# Patient Record
Sex: Female | Born: 1950 | Race: Black or African American | Hispanic: No | Marital: Married | Smoking: Never smoker
Health system: Southern US, Community
[De-identification: ages and names within clinical notes are randomized; demographics above are authoritative.]

## PROBLEM LIST (undated history)

## (undated) DIAGNOSIS — E785 Hyperlipidemia, unspecified: Secondary | ICD-10-CM

## (undated) DIAGNOSIS — N183 Chronic kidney disease, stage 3 unspecified: Secondary | ICD-10-CM

## (undated) DIAGNOSIS — Z9889 Other specified postprocedural states: Secondary | ICD-10-CM

## (undated) DIAGNOSIS — R9389 Abnormal findings on diagnostic imaging of other specified body structures: Secondary | ICD-10-CM

## (undated) DIAGNOSIS — M810 Age-related osteoporosis without current pathological fracture: Secondary | ICD-10-CM

## (undated) DIAGNOSIS — K08109 Complete loss of teeth, unspecified cause, unspecified class: Secondary | ICD-10-CM

## (undated) DIAGNOSIS — I10 Essential (primary) hypertension: Secondary | ICD-10-CM

## (undated) DIAGNOSIS — R112 Nausea with vomiting, unspecified: Secondary | ICD-10-CM

## (undated) HISTORY — PX: TUBAL LIGATION: SHX77

## (undated) HISTORY — PX: BREAST EXCISIONAL BIOPSY: SUR124

---

## 1999-04-23 ENCOUNTER — Encounter: Payer: Self-pay | Admitting: Gastroenterology

## 1999-04-23 ENCOUNTER — Encounter: Admission: RE | Admit: 1999-04-23 | Discharge: 1999-04-23 | Payer: Self-pay | Admitting: Gastroenterology

## 1999-05-06 ENCOUNTER — Other Ambulatory Visit: Admission: RE | Admit: 1999-05-06 | Discharge: 1999-05-06 | Payer: Self-pay | Admitting: Obstetrics and Gynecology

## 2000-04-24 ENCOUNTER — Encounter: Admission: RE | Admit: 2000-04-24 | Discharge: 2000-04-24 | Payer: Self-pay | Admitting: Obstetrics and Gynecology

## 2000-04-24 ENCOUNTER — Encounter: Payer: Self-pay | Admitting: Obstetrics and Gynecology

## 2000-05-07 ENCOUNTER — Other Ambulatory Visit: Admission: RE | Admit: 2000-05-07 | Discharge: 2000-05-07 | Payer: Self-pay | Admitting: Obstetrics and Gynecology

## 2001-05-06 ENCOUNTER — Encounter: Payer: Self-pay | Admitting: Obstetrics and Gynecology

## 2001-05-06 ENCOUNTER — Encounter: Admission: RE | Admit: 2001-05-06 | Discharge: 2001-05-06 | Payer: Self-pay | Admitting: Obstetrics and Gynecology

## 2001-05-10 ENCOUNTER — Other Ambulatory Visit: Admission: RE | Admit: 2001-05-10 | Discharge: 2001-05-10 | Payer: Self-pay | Admitting: Obstetrics and Gynecology

## 2002-05-18 ENCOUNTER — Encounter: Admission: RE | Admit: 2002-05-18 | Discharge: 2002-05-18 | Payer: Self-pay | Admitting: Obstetrics and Gynecology

## 2002-05-18 ENCOUNTER — Encounter: Payer: Self-pay | Admitting: Obstetrics and Gynecology

## 2002-07-07 HISTORY — PX: BREAST EXCISIONAL BIOPSY: SUR124

## 2003-05-31 ENCOUNTER — Encounter: Admission: RE | Admit: 2003-05-31 | Discharge: 2003-05-31 | Payer: Self-pay | Admitting: Obstetrics and Gynecology

## 2004-06-03 ENCOUNTER — Encounter: Admission: RE | Admit: 2004-06-03 | Discharge: 2004-06-03 | Payer: Self-pay | Admitting: Obstetrics and Gynecology

## 2005-06-23 ENCOUNTER — Encounter: Admission: RE | Admit: 2005-06-23 | Discharge: 2005-06-23 | Payer: Self-pay | Admitting: Obstetrics and Gynecology

## 2005-07-17 ENCOUNTER — Encounter: Admission: RE | Admit: 2005-07-17 | Discharge: 2005-07-17 | Payer: Self-pay

## 2006-07-09 ENCOUNTER — Encounter: Admission: RE | Admit: 2006-07-09 | Discharge: 2006-07-09 | Payer: Self-pay | Admitting: Obstetrics and Gynecology

## 2006-07-29 ENCOUNTER — Encounter: Admission: RE | Admit: 2006-07-29 | Discharge: 2006-07-29 | Payer: Self-pay | Admitting: Obstetrics and Gynecology

## 2007-07-13 ENCOUNTER — Encounter: Admission: RE | Admit: 2007-07-13 | Discharge: 2007-07-13 | Payer: Self-pay | Admitting: Obstetrics and Gynecology

## 2008-07-13 ENCOUNTER — Encounter: Admission: RE | Admit: 2008-07-13 | Discharge: 2008-07-13 | Payer: Self-pay | Admitting: Obstetrics and Gynecology

## 2009-07-16 ENCOUNTER — Encounter: Admission: RE | Admit: 2009-07-16 | Discharge: 2009-07-16 | Payer: Self-pay | Admitting: Obstetrics and Gynecology

## 2010-07-18 ENCOUNTER — Encounter
Admission: RE | Admit: 2010-07-18 | Discharge: 2010-07-18 | Payer: Self-pay | Source: Home / Self Care | Attending: Obstetrics and Gynecology | Admitting: Obstetrics and Gynecology

## 2011-04-23 ENCOUNTER — Observation Stay (HOSPITAL_COMMUNITY)
Admission: EM | Admit: 2011-04-23 | Discharge: 2011-04-24 | Disposition: A | Discharge status: Home / Self Care | Payer: 59 | Source: Ambulatory Visit | Attending: Family Medicine | Admitting: Family Medicine

## 2011-04-23 ENCOUNTER — Emergency Department (HOSPITAL_COMMUNITY): Payer: 59

## 2011-04-23 DIAGNOSIS — N189 Chronic kidney disease, unspecified: Secondary | ICD-10-CM | POA: Insufficient documentation

## 2011-04-23 DIAGNOSIS — E785 Hyperlipidemia, unspecified: Secondary | ICD-10-CM | POA: Insufficient documentation

## 2011-04-23 DIAGNOSIS — I498 Other specified cardiac arrhythmias: Secondary | ICD-10-CM | POA: Insufficient documentation

## 2011-04-23 DIAGNOSIS — R079 Chest pain, unspecified: Principal | ICD-10-CM | POA: Insufficient documentation

## 2011-04-23 LAB — CK TOTAL AND CKMB (NOT AT ARMC)
Relative Index: 1.8 (ref 0.0–2.5)
Total CK: 112 U/L (ref 7–177)

## 2011-04-23 LAB — POCT I-STAT, CHEM 8
Chloride: 110 mEq/L (ref 96–112)
Creatinine, Ser: 1 mg/dL (ref 0.50–1.10)
Glucose, Bld: 93 mg/dL (ref 70–99)
HCT: 42 % (ref 36.0–46.0)
Hemoglobin: 14.3 g/dL (ref 12.0–15.0)
TCO2: 21 mmol/L (ref 0–100)

## 2011-04-23 LAB — POCT I-STAT TROPONIN I: Troponin i, poc: 0 ng/mL (ref 0.00–0.08)

## 2011-04-23 LAB — CARDIAC PANEL(CRET KIN+CKTOT+MB+TROPI): Troponin I: <0.3 ng/mL (ref <0.30)

## 2011-04-24 LAB — CARDIAC PANEL(CRET KIN+CKTOT+MB+TROPI)
Relative Index: INVALID (ref 0.0–2.5)
Troponin I: <0.3 ng/mL (ref <0.30)

## 2011-04-24 LAB — BASIC METABOLIC PANEL
Chloride: 111 mEq/L (ref 96–112)
GFR calc Af Amer: 65 mL/min — ABNORMAL LOW (ref >90)
GFR calc non Af Amer: 56 mL/min — ABNORMAL LOW (ref >90)

## 2011-04-24 LAB — LIPID PANEL
Cholesterol: 200 mg/dL (ref 0–200)
HDL: 68 mg/dL (ref >39)
Total CHOL/HDL Ratio: 2.9 RATIO

## 2011-05-02 NOTE — Discharge Summary (Signed)
NAME:  Mallory Gaines, Mallory Gaines NO.:  [id number]  MEDICAL RECORD NO.:  [id number]  LOCATION:  4705                         FACILITY:  [facility]  PHYSICIAN:  Pleas Koch, MD        DATE OF BIRTH:  06/15/1951  DATE OF ADMISSION:  04/23/2011 DATE OF DISCHARGE:                              DISCHARGE SUMMARY   DISCHARGE DIAGNOSES: 1. Likely, noncardiogenic chest pain. 2. Sinus bradycardia, likely secondary to excellent aerobic     conditioning. 3. Borderline hypertension. 4. Hyperlipidemia with an LDL this admission of 121. 5. Baseline chronic kidney disease, probably stage I.  DISCHARGE MEDICATIONS: 1. Felodipine, please note change from timing from p.m. to am dosing     10 mg. 2. Aspirin 81 mg daily over the counter.  PERTINENT IMAGING STUDIES:  Chest x-ray, 2-view, April 23, 2011, showed mild cardiomegaly.  No active cardiopulmonary disease.  This is briefly a pleasant 60 year old African American female who presented with chest pain while she was getting ready to go to her mother's doctor appointment at 11:00 am.  She was having sharp pain, 8/10 intensity without specific radiation, did not last for very long time.  She has had some of these issues in the past, but states at this time, she had some pain in her left arm as well.  She did note that she did clean her house in entirety the day before coming to the hospital and did unaccustomed pushing and pulling.  The patient is at baseline very active and goes to the gym 3 times a day, and does primarily aerobics.  HOSPITAL COURSE: 1. Chest pain.  She was thought to have a TIMI score of 1 given her     strong family history of the same which equivalents to 5% risk.     She was ruled out by cardiac enzymes.  Repeat EKG done on day of     discharge compared to prior showed sinus rhythm, PR interval is     0.12, left anterior fascicular block, T-wave flattening V2 through     V4, however no overt J-point elevation  or ST-segment elevation.  She was thought to be stable for discharge.  I did get an echocardiogram which has not been reported upon as yet, but will be followed up in the outpatient setting, and if need be, I will call her back with regards to the report if it is abnormal.  I have explained the risks and benefits of going home carefully to the patient, and I have instructed her if she does have recurring chest pain, to present herself back to the emergency room. The patient insists on going home. 1. Sinus bradycardia.  This is sinus rhythm with no pauses on     telemetry, and likely secondary to her excellent cardiovascular     conditions.  No further workup. 2. CKD stage 1.  The patient has some elements of CKD, which is likely     secondary to her hypertensive heart disease.  I would recommend     strongly changing her over from felodipine, which has no     cardioprotective abilities to, either an ACE inhibitor or low-dose  metoprolol.  I am placing her on aspirin empirically 81 mg on     discharge and she can be followed up with Dr. Magda Paganini, her     primary care physician.  I have recommended to her to possibly have     a exercise stress test and within this is shown to be a level of     evidence too, but as she is at low to low risk probability for MI,     this would effectively have high specificity.  I leave this up to     the termination of Dr. Allie Dimmer.  PHYSICAL EXAMINATION:  VITAL SIGNS:  On day of discharge, temperature is 98.1, pulse was 67, respirations 18, blood pressure is 120-113/75-58, O2 sats 95% on room air. GENERAL:  She is pleasant alert, oriented, smiling, in no significant distress. CHEST:  Clinically clear. HEART:  S1, S2.  No murmurs, rubs, or gallops. ABDOMEN:  Soft, nontender.  No peripheral edema.  She did have some reproducibility this time on exam compared to admission exam to her chest pain.  It was pleasure taking care of this patient in the  hospital.  I spent under 30 minutes in time coordinating her discharge.          ______________________________ Pleas Koch, MD     JS/MEDQ  D:  04/24/2011  T:  04/24/2011  Job:  [id number]  Electronically Signed by Pleas Koch MD on 05/02/2011 02:24:59 PM

## 2011-05-02 NOTE — Discharge Summary (Signed)
  NAMEMarland Gaines  SAVON, COBBS NO.:  [id number]  MEDICAL RECORD NO.:  [id number]  LOCATION:  4705                         FACILITY:  [facility]  PHYSICIAN:  Pleas Koch, MD        DATE OF BIRTH:  04-21-1951  DATE OF ADMISSION:  04/23/2011 DATE OF DISCHARGE:  04/24/2011                              DISCHARGE SUMMARY   ADDENDUM  Addendum to dictation number [id number].  The patient did not have echocardiogram.  It is unclear to me as to why this did not occur.  I received a text from Dr. Yates Decamp saying that this was not on his work list, although I had specifically ordered the same.  I did call Ms. Coghlan on April 25, 2011, to confirm she is not having any further chest pain or any other issues.  I have instructed her specifically to see Dr. Earl Gala in 5-7 days and ensure that she is seen for possible risk factor modification, although I think she is at very very low risk of any cardiac event.  She may benefit from outpatient stress test exercise as she is in excellent condition versus Myoview if Dr. Earl Gala who knows her much better than I do feels that this is necessary.  Please mark this dictation as priority.  The patient understood fully the implications of my discussion and was conversant with the same.          ______________________________ Pleas Koch, MD     JS/MEDQ  D:  04/25/2011  T:  04/25/2011  Job:  [id number]  Electronically Signed by Pleas Koch MD on 05/02/2011 02:25:22 PM

## 2011-05-02 NOTE — H&P (Signed)
NAME:  Mallory Gaines, Mallory Gaines NO.:  [id number]  MEDICAL RECORD NO.:  [id number]  LOCATION:  4705                         FACILITY:  [facility]  PHYSICIAN:  Pleas Koch, MD        DATE OF BIRTH:  [birth year]  DATE OF ADMISSION:  04/23/2011 DATE OF DISCHARGE:                             HISTORY & PHYSICAL   CHIEF COMPLAINT:  Chest pain.  HISTORY OF PRESENT ILLNESS:  Briefly, the patient is a very pleasant 60- year-old [place] American female who complained of pain in her chest this morning at around 11:00 a.m.  She said that this was on the left side of her chest and it had 2 qualities; it was heaviness and sharpness and 8/10 in intensity without any specific radiation.  She states, however, that she had some tingling in her left forearm, and when she lay down, she could not get up without severe pain.  The pain seemed to be continues and did not last for a very long time.  She states she has had this pain in the past as well, but never had any arm issues and she could attribute those prior pains to exercise.  Of note, the patient cleaned her house in entirety yesterday, vacuumed, and did a lot of dusting.  The patient took 1 low-dose aspirin 81 mg and then called 911 as she was concerned about this pain, and then came to the emergency room after being told by dispatch to take 3 more aspirin.  By the time she arrived at the emergency room, she had no further chest pain.  REVIEW OF SYSTEMS:  Negative for blurred vision, double vision, shortness of breath, nausea, vomiting, dark urine, dark stool, or weakness on one side of body.  Chest pain at the present time is now 1/10.  She has no constipation.  She has no weakness.  She has no seizures.  She has no rash on her skin.  She has no skin breakdown. Actually, in no distress, and is sitting up, laughing and talking with the family.  PAST MEDICAL HISTORY:  Significant for borderline hypertension which is controlled on  amlodipine.  The patient's med rec has not been completed.  PAST SURGICAL HISTORY:  Significant for BTL 33 years ago, and she attained menopause at about 5-6 years ago at age 55.  She has had a breast biopsy in the past that was negative.  The reports of that are not available to me per E chart.  She is a G 1, P 1.  SOCIAL HISTORY:  She is retired and used to be an Environmental health practitioner at [employer].  She has never smoked or drank.  ALLERGIES:  SHE HAS NO KNOWN DRUG ALLERGIES.  FAMILY HISTORY:  Significant for father passing of a heart attack in his 2s.  Her mother is alive at 84, has dementia, hypertension, diabetes, COPD, and gout.  She has an elder sister who has a history of angina and some of her nieces and nephews have a history of cancer.  PHYSICAL EXAMINATION:  VITAL SIGNS:  On admission into the ED, she had 0/10 pain.  Her blood pressure is 124/75, temperature was 97.7, respirations 18, pulse 49-59,  and O2 sats 97%. HEENT:  On exam upstairs, she has arcus senilis.  She has no pallor, no icterus. NECK:  Soft.  No thyromegaly.  No lymphadenopathy.  I do not appreciate any JVD. CHEST:  Lungs are clear. CARDIOVASCULAR:  S1 and S2.  No murmurs, rubs, or gallops.  She is slightly bradycardic.  PMI is nondisplaced. ABDOMEN:  Soft, nontender, and nondistended. MUSCULOSKELETAL:  Her chest pain is nonreproducible and I am not able to reproduce the same. EXTREMITIES:  Her lower extremities are soft, nontender, and nondistended. CENTRAL NERVOUS SYSTEM:  Grossly intact without any focal deficits. Reflexes are normal.  LABORATORY DATA:  Workup done by ED included I-STAT which showed falling hemoglobin 14.3, and hematocrit 42.  Sodium 142, potassium 3.9, chloride 110, glucose 93, BUN to creatinine ratio is 13/1.0.  Ionized calcium 1.21.  Troponin point of care was 0.  Repeat troponin was negative. First set of cardiac enzymes were CK total was 112, CK-MB was 2.0, relative  index is 1.8.  Mild cardiomegaly seen on chest x-ray.  No active lung disease.  EKG done on arrival showed a rate of about 50-60. PR interval is 0.12.  She has a slightly leftward axis, possible left anterior fascicular block.  I do not appreciate any ST-T wave changes on the EKG.  She has low voltage complexes in V5 and V6.  I do not appreciate any Q-waves.  She has no chamber hypertrophy.  IMPRESSION/ASSESSMENT: 1. The patient has a TIMI score of 1, which equals to a 5% risk of 14     day all cause mortality from myocardial infarction and is at     relatively low risk.  We will rule her out by cardiac enzymes.  I     will get an echocardiogram, and if there is wall motion     abnormality, I will formally consult Cardiology who I have already     consulted for reading of the echocardiogram, Dr. Jacinto Halim.  The     patient is chest pain-free at this time and we will continue her on     aspirin 81 mg daily for now. 2. Hyperlipidemia.  The patient states that Dr. Allie Dimmer has recently     done LDL and it has always been low.  Nevertheless, I will repeat     the same today and ensure that is the case. 3. Hypertension.  She is on felodipine at home, and given her relative     bradycardia, I will hold it for now.  She may benefit from being on     a thiazide versus an ACE inhibitor when she is reviewed. 4. Sinus bradycardia.  This is likely conditioned based on her     excellent conditioning.  The patient mentions she used to go to the     gym 3 times a week and does primarily aerobics, but has not done     for the past month.  The patient will be reviewed on a day-to-day     basis for the same, and will be kept on telemetry.  I spent over an hour in time coordinating this admission in addition to discussion with the family.  If the patient's echocardiogram is negative and she rules out, the patient will likely be discharged back to the care of Dr. Allie Dimmer as an outpatient.  Thank you for this  opportunity to take care of this pleasant patient.          ______________________________ Pleas Koch, MD  JS/MEDQ  D:  04/23/2011  T:  04/23/2011  Job:  [id number]  cc:   Theressa Millard, M.D. Pamella Pert, MD  Electronically Signed by Pleas Koch MD on 05/02/2011 02:25:26 PM

## 2012-12-13 ENCOUNTER — Other Ambulatory Visit: Payer: Self-pay

## 2012-12-13 DIAGNOSIS — Z1231 Encounter for screening mammogram for malignant neoplasm of breast: Secondary | ICD-10-CM

## 2012-12-28 ENCOUNTER — Ambulatory Visit
Admission: RE | Admit: 2012-12-28 | Discharge: 2012-12-28 | Disposition: A | Discharge status: Home / Self Care | Payer: 59 | Source: Ambulatory Visit

## 2012-12-28 DIAGNOSIS — Z1231 Encounter for screening mammogram for malignant neoplasm of breast: Secondary | ICD-10-CM

## 2014-02-24 ENCOUNTER — Other Ambulatory Visit: Payer: Self-pay | Admitting: Obstetrics and Gynecology

## 2014-02-24 DIAGNOSIS — R928 Other abnormal and inconclusive findings on diagnostic imaging of breast: Secondary | ICD-10-CM

## 2014-03-02 ENCOUNTER — Other Ambulatory Visit: Payer: 59

## 2014-03-03 ENCOUNTER — Other Ambulatory Visit: Payer: Self-pay | Admitting: Obstetrics and Gynecology

## 2014-03-03 ENCOUNTER — Ambulatory Visit
Admission: RE | Admit: 2014-03-03 | Discharge: 2014-03-03 | Disposition: A | Discharge status: Home / Self Care | Payer: 59 | Source: Ambulatory Visit | Attending: Obstetrics and Gynecology | Admitting: Obstetrics and Gynecology

## 2014-03-03 ENCOUNTER — Encounter (INDEPENDENT_AMBULATORY_CARE_PROVIDER_SITE_OTHER): Payer: Self-pay

## 2014-03-03 DIAGNOSIS — R928 Other abnormal and inconclusive findings on diagnostic imaging of breast: Secondary | ICD-10-CM

## 2014-03-03 DIAGNOSIS — Z8739 Personal history of other diseases of the musculoskeletal system and connective tissue: Secondary | ICD-10-CM

## 2014-03-06 ENCOUNTER — Ambulatory Visit
Admission: RE | Admit: 2014-03-06 | Discharge: 2014-03-06 | Disposition: A | Discharge status: Home / Self Care | Payer: 59 | Source: Ambulatory Visit | Attending: Obstetrics and Gynecology | Admitting: Obstetrics and Gynecology

## 2014-03-06 DIAGNOSIS — Z8739 Personal history of other diseases of the musculoskeletal system and connective tissue: Secondary | ICD-10-CM

## 2014-05-03 ENCOUNTER — Other Ambulatory Visit: Payer: Self-pay | Admitting: Obstetrics and Gynecology

## 2014-05-03 DIAGNOSIS — N644 Mastodynia: Secondary | ICD-10-CM

## 2014-05-11 ENCOUNTER — Other Ambulatory Visit: Payer: Self-pay | Admitting: Obstetrics and Gynecology

## 2014-05-11 ENCOUNTER — Ambulatory Visit: Payer: 59

## 2014-05-11 ENCOUNTER — Ambulatory Visit
Admission: RE | Admit: 2014-05-11 | Discharge: 2014-05-11 | Disposition: A | Discharge status: Home / Self Care | Payer: 59 | Source: Ambulatory Visit | Attending: Obstetrics and Gynecology | Admitting: Obstetrics and Gynecology

## 2014-05-11 DIAGNOSIS — N644 Mastodynia: Secondary | ICD-10-CM

## 2015-01-24 ENCOUNTER — Ambulatory Visit
Admission: RE | Admit: 2015-01-24 | Discharge: 2015-01-24 | Disposition: A | Discharge status: Home / Self Care | Payer: Commercial Managed Care - HMO | Source: Ambulatory Visit | Attending: Internal Medicine | Admitting: Internal Medicine

## 2015-01-24 ENCOUNTER — Other Ambulatory Visit: Payer: Self-pay | Admitting: Internal Medicine

## 2015-01-24 DIAGNOSIS — M21962 Unspecified acquired deformity of left lower leg: Secondary | ICD-10-CM

## 2015-04-18 ENCOUNTER — Other Ambulatory Visit: Payer: Self-pay

## 2015-04-18 DIAGNOSIS — Z1231 Encounter for screening mammogram for malignant neoplasm of breast: Secondary | ICD-10-CM

## 2015-05-17 ENCOUNTER — Ambulatory Visit
Admission: RE | Admit: 2015-05-17 | Discharge: 2015-05-17 | Disposition: A | Discharge status: Home / Self Care | Payer: Commercial Managed Care - HMO | Source: Ambulatory Visit

## 2015-05-17 DIAGNOSIS — Z1231 Encounter for screening mammogram for malignant neoplasm of breast: Secondary | ICD-10-CM

## 2016-04-14 ENCOUNTER — Other Ambulatory Visit: Payer: Self-pay | Admitting: Obstetrics and Gynecology

## 2016-04-14 DIAGNOSIS — Z1231 Encounter for screening mammogram for malignant neoplasm of breast: Secondary | ICD-10-CM

## 2016-04-23 ENCOUNTER — Ambulatory Visit
Admission: RE | Admit: 2016-04-23 | Discharge: 2016-04-23 | Disposition: A | Discharge status: Home / Self Care | Payer: Medicare Other | Source: Ambulatory Visit | Attending: Internal Medicine | Admitting: Internal Medicine

## 2016-04-23 ENCOUNTER — Other Ambulatory Visit: Payer: Self-pay | Admitting: Internal Medicine

## 2016-04-23 DIAGNOSIS — M1990 Unspecified osteoarthritis, unspecified site: Secondary | ICD-10-CM

## 2016-05-20 ENCOUNTER — Ambulatory Visit
Admission: RE | Admit: 2016-05-20 | Discharge: 2016-05-20 | Disposition: A | Discharge status: Home / Self Care | Payer: Medicare Other | Source: Ambulatory Visit | Attending: Obstetrics and Gynecology | Admitting: Obstetrics and Gynecology

## 2016-05-20 DIAGNOSIS — Z1231 Encounter for screening mammogram for malignant neoplasm of breast: Secondary | ICD-10-CM

## 2016-05-23 ENCOUNTER — Ambulatory Visit
Admission: RE | Admit: 2016-05-23 | Discharge: 2016-05-23 | Disposition: A | Discharge status: Home / Self Care | Payer: Medicare Other | Source: Ambulatory Visit | Attending: Internal Medicine | Admitting: Internal Medicine

## 2016-05-23 ENCOUNTER — Other Ambulatory Visit: Payer: Self-pay | Admitting: Internal Medicine

## 2016-05-23 DIAGNOSIS — M5410 Radiculopathy, site unspecified: Secondary | ICD-10-CM

## 2016-09-03 ENCOUNTER — Other Ambulatory Visit: Payer: Self-pay | Admitting: Obstetrics and Gynecology

## 2016-09-03 DIAGNOSIS — N644 Mastodynia: Secondary | ICD-10-CM

## 2016-09-08 ENCOUNTER — Ambulatory Visit
Admission: RE | Admit: 2016-09-08 | Discharge: 2016-09-08 | Disposition: A | Discharge status: Home / Self Care | Payer: Medicare Other | Source: Ambulatory Visit | Attending: Obstetrics and Gynecology | Admitting: Obstetrics and Gynecology

## 2016-09-08 DIAGNOSIS — N644 Mastodynia: Secondary | ICD-10-CM

## 2017-04-07 ENCOUNTER — Other Ambulatory Visit: Payer: Self-pay | Admitting: Obstetrics and Gynecology

## 2017-04-07 DIAGNOSIS — Z1231 Encounter for screening mammogram for malignant neoplasm of breast: Secondary | ICD-10-CM

## 2017-05-21 ENCOUNTER — Ambulatory Visit
Admission: RE | Admit: 2017-05-21 | Discharge: 2017-05-21 | Disposition: A | Discharge status: Home / Self Care | Payer: Medicare Other | Source: Ambulatory Visit | Attending: Obstetrics and Gynecology | Admitting: Obstetrics and Gynecology

## 2017-05-21 ENCOUNTER — Encounter: Payer: Self-pay | Admitting: Radiology

## 2017-05-21 DIAGNOSIS — Z1231 Encounter for screening mammogram for malignant neoplasm of breast: Secondary | ICD-10-CM

## 2017-11-02 ENCOUNTER — Ambulatory Visit
Admission: RE | Admit: 2017-11-02 | Discharge: 2017-11-02 | Disposition: A | Discharge status: Home / Self Care | Payer: Medicare Other | Source: Ambulatory Visit | Attending: Internal Medicine | Admitting: Internal Medicine

## 2017-11-02 ENCOUNTER — Other Ambulatory Visit: Payer: Self-pay | Admitting: Internal Medicine

## 2017-11-02 DIAGNOSIS — R2243 Localized swelling, mass and lump, lower limb, bilateral: Secondary | ICD-10-CM

## 2017-12-13 ENCOUNTER — Emergency Department (HOSPITAL_COMMUNITY)
Admission: EM | Admit: 2017-12-13 | Discharge: 2017-12-13 | Disposition: A | Discharge status: Home / Self Care | Payer: Medicare Other | Attending: Emergency Medicine | Admitting: Emergency Medicine

## 2017-12-13 ENCOUNTER — Encounter (HOSPITAL_COMMUNITY): Payer: Self-pay | Admitting: Emergency Medicine

## 2017-12-13 DIAGNOSIS — W57XXXA Bitten or stung by nonvenomous insect and other nonvenomous arthropods, initial encounter: Secondary | ICD-10-CM

## 2017-12-13 DIAGNOSIS — Z23 Encounter for immunization: Secondary | ICD-10-CM | POA: Diagnosis not present

## 2017-12-13 DIAGNOSIS — W57XXXD Bitten or stung by nonvenomous insect and other nonvenomous arthropods, subsequent encounter: Secondary | ICD-10-CM | POA: Insufficient documentation

## 2017-12-13 DIAGNOSIS — Y939 Activity, unspecified: Secondary | ICD-10-CM | POA: Insufficient documentation

## 2017-12-13 DIAGNOSIS — Y999 Unspecified external cause status: Secondary | ICD-10-CM | POA: Diagnosis not present

## 2017-12-13 DIAGNOSIS — S1086XD Insect bite of other specified part of neck, subsequent encounter: Secondary | ICD-10-CM | POA: Insufficient documentation

## 2017-12-13 DIAGNOSIS — I10 Essential (primary) hypertension: Secondary | ICD-10-CM | POA: Diagnosis not present

## 2017-12-13 DIAGNOSIS — Y929 Unspecified place or not applicable: Secondary | ICD-10-CM | POA: Insufficient documentation

## 2017-12-13 HISTORY — DX: Essential (primary) hypertension: I10

## 2017-12-13 MED ORDER — TETANUS-DIPHTH-ACELL PERTUSSIS 5-2.5-18.5 LF-MCG/0.5 IM SUSP
0.5000 mL | Freq: Once | INTRAMUSCULAR | Status: AC
  Administered 2017-12-13: 0.5 mL via INTRAMUSCULAR
  Filled 2017-12-13: qty 0.5

## 2017-12-13 NOTE — ED Triage Notes (Addendum)
Pt has a tick embedded in her neck tissue, movement noted to the tick legs on assessment. Alcohol pads placed over top. Pt states it has been there for several days and she thought it was a mole, plucked a leg that she thought was a hair.

## 2017-12-13 NOTE — Discharge Instructions (Addendum)
If you develop any signs of RMSF return to the ED.

## 2017-12-13 NOTE — ED Provider Notes (Signed)
[facility]   CSN: [id number] Arrival date & time: 12/13/17  1711     History   Chief Complaint No chief complaint on file.   HPI Mallory Gaines is a 67 y.o. female who presents to the ED with a tick bite to the left side of her neck. Patient reports that it has been there for 4 days. When she first saw it she thought it was a mole but then today she felt it moving and realized it was a tick.  The history is provided by the patient. No language interpreter was used.  Animal Bite  Attacking animal: tick. Location:  Head/neck Head/neck injury location:  L neck Time since incident:  4 days Pain details:    Severity:  No pain Incident location:  Unable to specify   Past Medical History:  Diagnosis Date  . Hypertension     There are no active problems to display for this patient.   Past Surgical History:  Procedure Laterality Date  . BREAST EXCISIONAL BIOPSY Left      OB History   None      Home Medications    Prior to Admission medications   Not on File    Family History Family History  Problem Relation Age of Onset  . Breast cancer Other     Social History Social History   Tobacco Use  . Smoking status: Not on file  Substance Use Topics  . Alcohol use: Not on file  . Drug use: Not on file     Allergies   Patient has no known allergies.   Review of Systems Review of Systems  Skin: Positive for wound.  All other systems reviewed and are negative.    Physical Exam Updated Vital Signs BP 132/75   Pulse 86   Temp 98.2 F (36.8 C)   Resp 18   SpO2 98%   Physical Exam  Constitutional: She appears well-developed and well-nourished. No distress.  HENT:  Head: Normocephalic.  Eyes: EOM are normal.  Neck: Neck supple.  Tick attached to the left side of neck.  Cardiovascular: Normal rate.  Pulmonary/Chest: Effort normal.  Abdominal: Soft. There is no tenderness.    Musculoskeletal: Normal range of motion.  Neurological: She is alert.  Skin: Skin is warm and dry.  Psychiatric: She has a normal mood and affect. Her behavior is normal.  Nursing note and vitals reviewed.    ED Treatments / Results  Labs (all labs ordered are listed, but only abnormal results are displayed) Labs Reviewed - No data to display  Radiology No results found.  Procedures .Foreign Body Removal Date/Time: 12/13/2017 5:37 PM Performed by: Janne NapoleonNeese, Daved Mcfann M, NP Authorized by: Janne NapoleonNeese, Garion Wempe M, NP  Consent: Verbal consent obtained. Consent given by: patient Patient understanding: patient states understanding of the procedure being performed Required items: required blood products, implants, devices, and special equipment available Patient identity confirmed: verbally with patient Body area: skin Anesthesia method: none.  Sedation: Patient sedated: no  Patient restrained: no Complexity: simple 1 objects recovered. Objects recovered: tick Post-procedure assessment: foreign body removed Patient tolerance: Patient tolerated the procedure well with no immediate complications Comments: Cleaned with alcohol, bacitracin ointment and dressing. Tetanus updated.    (including critical care time)  Medications Ordered in ED Medications  Tdap (BOOSTRIX) injection 0.5 mL (has no administration in time range)     Initial Impression / Assessment and Plan / ED Course  I have reviewed  the triage vital signs and the nursing notes. 67 y.o. female here for tick removal stable for d/c without signs of RMSF. Discussed with the patient signs and symptoms and return precautions.   Final Clinical Impressions(s) / ED Diagnoses   Final diagnoses:  Tick bite with subsequent removal of tick    ED Discharge Orders    None       Kerrie Buffalo Sterling, NP 12/13/17 1741    Charlynne Pander, MD 12/15/17 [phone]

## 2017-12-13 NOTE — ED Notes (Signed)
Tick removed by NP and RN. Bacitracin and band-aid placed on neck.

## 2018-03-23 ENCOUNTER — Other Ambulatory Visit: Payer: Self-pay | Admitting: Obstetrics and Gynecology

## 2018-03-23 DIAGNOSIS — Z1231 Encounter for screening mammogram for malignant neoplasm of breast: Secondary | ICD-10-CM

## 2018-05-24 ENCOUNTER — Ambulatory Visit
Admission: RE | Admit: 2018-05-24 | Discharge: 2018-05-24 | Disposition: A | Discharge status: Home / Self Care | Payer: Medicare Other | Source: Ambulatory Visit | Attending: Obstetrics and Gynecology | Admitting: Obstetrics and Gynecology

## 2018-05-24 DIAGNOSIS — Z1231 Encounter for screening mammogram for malignant neoplasm of breast: Secondary | ICD-10-CM

## 2018-07-19 ENCOUNTER — Other Ambulatory Visit: Payer: Self-pay | Admitting: Internal Medicine

## 2018-07-19 DIAGNOSIS — M810 Age-related osteoporosis without current pathological fracture: Secondary | ICD-10-CM

## 2018-08-11 ENCOUNTER — Other Ambulatory Visit: Payer: Self-pay | Admitting: Obstetrics and Gynecology

## 2018-08-11 DIAGNOSIS — Z9189 Other specified personal risk factors, not elsewhere classified: Secondary | ICD-10-CM

## 2018-08-24 ENCOUNTER — Ambulatory Visit
Admission: RE | Admit: 2018-08-24 | Discharge: 2018-08-24 | Disposition: A | Discharge status: Home / Self Care | Payer: No Typology Code available for payment source | Source: Ambulatory Visit | Attending: Obstetrics and Gynecology | Admitting: Obstetrics and Gynecology

## 2018-08-24 DIAGNOSIS — Z9189 Other specified personal risk factors, not elsewhere classified: Secondary | ICD-10-CM

## 2018-08-24 MED ORDER — GADOBUTROL 1 MMOL/ML IV SOLN
7.0000 mL | Freq: Once | INTRAVENOUS | Status: AC | PRN
  Administered 2018-08-24: 7 mL via INTRAVENOUS

## 2018-09-10 ENCOUNTER — Ambulatory Visit
Admission: RE | Admit: 2018-09-10 | Discharge: 2018-09-10 | Disposition: A | Discharge status: Home / Self Care | Payer: Medicare Other | Source: Ambulatory Visit | Attending: Internal Medicine | Admitting: Internal Medicine

## 2018-09-10 DIAGNOSIS — M810 Age-related osteoporosis without current pathological fracture: Secondary | ICD-10-CM

## 2019-04-19 ENCOUNTER — Other Ambulatory Visit: Payer: Self-pay | Admitting: Internal Medicine

## 2019-04-19 DIAGNOSIS — Z1231 Encounter for screening mammogram for malignant neoplasm of breast: Secondary | ICD-10-CM

## 2019-05-02 ENCOUNTER — Other Ambulatory Visit: Payer: Self-pay | Admitting: Obstetrics and Gynecology

## 2019-05-02 DIAGNOSIS — N644 Mastodynia: Secondary | ICD-10-CM

## 2019-05-10 ENCOUNTER — Ambulatory Visit
Admission: RE | Admit: 2019-05-10 | Discharge: 2019-05-10 | Disposition: A | Discharge status: Home / Self Care | Payer: Medicare Other | Source: Ambulatory Visit | Attending: Obstetrics and Gynecology | Admitting: Obstetrics and Gynecology

## 2019-05-10 ENCOUNTER — Other Ambulatory Visit: Payer: Self-pay

## 2019-05-10 DIAGNOSIS — N644 Mastodynia: Secondary | ICD-10-CM

## 2019-06-07 ENCOUNTER — Ambulatory Visit
Admission: RE | Admit: 2019-06-07 | Discharge: 2019-06-07 | Disposition: A | Discharge status: Home / Self Care | Payer: Medicare Other | Source: Ambulatory Visit | Attending: Internal Medicine | Admitting: Internal Medicine

## 2019-06-07 ENCOUNTER — Other Ambulatory Visit: Payer: Self-pay

## 2019-06-07 DIAGNOSIS — Z1231 Encounter for screening mammogram for malignant neoplasm of breast: Secondary | ICD-10-CM

## 2019-07-29 ENCOUNTER — Other Ambulatory Visit: Payer: Self-pay | Admitting: Internal Medicine

## 2019-07-29 ENCOUNTER — Ambulatory Visit
Admission: RE | Admit: 2019-07-29 | Discharge: 2019-07-29 | Disposition: A | Discharge status: Home / Self Care | Payer: Medicare Other | Source: Ambulatory Visit | Attending: Internal Medicine | Admitting: Internal Medicine

## 2019-07-29 DIAGNOSIS — R1012 Left upper quadrant pain: Secondary | ICD-10-CM

## 2019-09-29 ENCOUNTER — Other Ambulatory Visit: Payer: Self-pay | Admitting: Gastroenterology

## 2019-09-29 DIAGNOSIS — R1032 Left lower quadrant pain: Secondary | ICD-10-CM

## 2019-10-14 ENCOUNTER — Ambulatory Visit
Admission: RE | Admit: 2019-10-14 | Discharge: 2019-10-14 | Disposition: A | Discharge status: Home / Self Care | Payer: Medicare Other | Source: Ambulatory Visit | Attending: Gastroenterology | Admitting: Gastroenterology

## 2019-10-14 DIAGNOSIS — R1032 Left lower quadrant pain: Secondary | ICD-10-CM

## 2019-10-14 MED ORDER — IOPAMIDOL (ISOVUE-300) INJECTION 61%
100.0000 mL | Freq: Once | INTRAVENOUS | Status: AC | PRN
  Administered 2019-10-14: 100 mL via INTRAVENOUS

## 2020-04-06 ENCOUNTER — Other Ambulatory Visit: Payer: Self-pay | Admitting: Obstetrics and Gynecology

## 2020-04-06 DIAGNOSIS — N644 Mastodynia: Secondary | ICD-10-CM

## 2020-04-16 ENCOUNTER — Ambulatory Visit: Admission: RE | Admit: 2020-04-16 | Payer: Medicare Other | Source: Ambulatory Visit

## 2020-04-16 ENCOUNTER — Ambulatory Visit
Admission: RE | Admit: 2020-04-16 | Discharge: 2020-04-16 | Disposition: A | Discharge status: Home / Self Care | Payer: Medicare Other | Source: Ambulatory Visit | Attending: Obstetrics and Gynecology | Admitting: Obstetrics and Gynecology

## 2020-04-16 ENCOUNTER — Other Ambulatory Visit: Payer: Self-pay

## 2020-04-16 DIAGNOSIS — N644 Mastodynia: Secondary | ICD-10-CM

## 2020-04-25 ENCOUNTER — Other Ambulatory Visit: Payer: Medicare Other

## 2020-04-26 ENCOUNTER — Other Ambulatory Visit: Payer: Self-pay | Admitting: Obstetrics and Gynecology

## 2020-04-26 DIAGNOSIS — Z1231 Encounter for screening mammogram for malignant neoplasm of breast: Secondary | ICD-10-CM

## 2020-06-07 ENCOUNTER — Ambulatory Visit: Payer: Medicare Other

## 2020-07-18 ENCOUNTER — Ambulatory Visit: Payer: Medicare Other

## 2020-07-30 ENCOUNTER — Ambulatory Visit
Admission: RE | Admit: 2020-07-30 | Discharge: 2020-07-30 | Disposition: A | Discharge status: Home / Self Care | Payer: Medicare Other | Source: Ambulatory Visit | Attending: Obstetrics and Gynecology | Admitting: Obstetrics and Gynecology

## 2020-07-30 ENCOUNTER — Other Ambulatory Visit: Payer: Self-pay

## 2020-07-30 DIAGNOSIS — Z1231 Encounter for screening mammogram for malignant neoplasm of breast: Secondary | ICD-10-CM

## 2020-08-14 DIAGNOSIS — E785 Hyperlipidemia, unspecified: Secondary | ICD-10-CM | POA: Diagnosis not present

## 2020-08-14 DIAGNOSIS — K589 Irritable bowel syndrome without diarrhea: Secondary | ICD-10-CM | POA: Diagnosis not present

## 2020-08-14 DIAGNOSIS — I10 Essential (primary) hypertension: Secondary | ICD-10-CM | POA: Diagnosis not present

## 2020-08-14 DIAGNOSIS — M1990 Unspecified osteoarthritis, unspecified site: Secondary | ICD-10-CM | POA: Diagnosis not present

## 2020-08-14 DIAGNOSIS — J45909 Unspecified asthma, uncomplicated: Secondary | ICD-10-CM | POA: Diagnosis not present

## 2020-08-14 DIAGNOSIS — Z1389 Encounter for screening for other disorder: Secondary | ICD-10-CM | POA: Diagnosis not present

## 2020-08-14 DIAGNOSIS — M810 Age-related osteoporosis without current pathological fracture: Secondary | ICD-10-CM | POA: Diagnosis not present

## 2020-08-14 DIAGNOSIS — Z7189 Other specified counseling: Secondary | ICD-10-CM | POA: Diagnosis not present

## 2020-08-14 DIAGNOSIS — Z0000 Encounter for general adult medical examination without abnormal findings: Secondary | ICD-10-CM | POA: Diagnosis not present

## 2020-08-14 DIAGNOSIS — N183 Chronic kidney disease, stage 3 unspecified: Secondary | ICD-10-CM | POA: Diagnosis not present

## 2020-08-22 DIAGNOSIS — R072 Precordial pain: Secondary | ICD-10-CM | POA: Diagnosis not present

## 2020-08-22 DIAGNOSIS — N183 Chronic kidney disease, stage 3 unspecified: Secondary | ICD-10-CM | POA: Diagnosis not present

## 2020-08-22 DIAGNOSIS — E785 Hyperlipidemia, unspecified: Secondary | ICD-10-CM | POA: Diagnosis not present

## 2020-10-09 DIAGNOSIS — R1084 Generalized abdominal pain: Secondary | ICD-10-CM | POA: Diagnosis not present

## 2020-10-09 DIAGNOSIS — Z86010 Personal history of colonic polyps: Secondary | ICD-10-CM | POA: Diagnosis not present

## 2020-10-09 DIAGNOSIS — K649 Unspecified hemorrhoids: Secondary | ICD-10-CM | POA: Diagnosis not present

## 2020-10-09 DIAGNOSIS — K5900 Constipation, unspecified: Secondary | ICD-10-CM | POA: Diagnosis not present

## 2021-03-20 DIAGNOSIS — I10 Essential (primary) hypertension: Secondary | ICD-10-CM | POA: Diagnosis not present

## 2021-03-20 DIAGNOSIS — J302 Other seasonal allergic rhinitis: Secondary | ICD-10-CM | POA: Diagnosis not present

## 2021-03-20 DIAGNOSIS — Z23 Encounter for immunization: Secondary | ICD-10-CM | POA: Diagnosis not present

## 2021-03-20 DIAGNOSIS — M8588 Other specified disorders of bone density and structure, other site: Secondary | ICD-10-CM | POA: Diagnosis not present

## 2021-03-20 DIAGNOSIS — E785 Hyperlipidemia, unspecified: Secondary | ICD-10-CM | POA: Diagnosis not present

## 2021-04-29 ENCOUNTER — Other Ambulatory Visit: Payer: Self-pay | Admitting: Internal Medicine

## 2021-04-29 DIAGNOSIS — Z1231 Encounter for screening mammogram for malignant neoplasm of breast: Secondary | ICD-10-CM

## 2021-05-06 DIAGNOSIS — H5211 Myopia, right eye: Secondary | ICD-10-CM | POA: Diagnosis not present

## 2021-05-06 DIAGNOSIS — H25013 Cortical age-related cataract, bilateral: Secondary | ICD-10-CM | POA: Diagnosis not present

## 2021-05-06 DIAGNOSIS — H2513 Age-related nuclear cataract, bilateral: Secondary | ICD-10-CM | POA: Diagnosis not present

## 2021-05-06 DIAGNOSIS — H524 Presbyopia: Secondary | ICD-10-CM | POA: Diagnosis not present

## 2021-07-19 DIAGNOSIS — R300 Dysuria: Secondary | ICD-10-CM | POA: Diagnosis not present

## 2021-08-01 ENCOUNTER — Other Ambulatory Visit: Payer: Self-pay

## 2021-08-01 ENCOUNTER — Ambulatory Visit
Admission: RE | Admit: 2021-08-01 | Discharge: 2021-08-01 | Disposition: A | Discharge status: Home / Self Care | Payer: Medicare Other | Source: Ambulatory Visit | Attending: Internal Medicine | Admitting: Internal Medicine

## 2021-08-01 DIAGNOSIS — Z1231 Encounter for screening mammogram for malignant neoplasm of breast: Secondary | ICD-10-CM | POA: Diagnosis not present

## 2021-08-09 ENCOUNTER — Other Ambulatory Visit: Payer: Self-pay | Admitting: Internal Medicine

## 2021-08-09 DIAGNOSIS — M810 Age-related osteoporosis without current pathological fracture: Secondary | ICD-10-CM

## 2021-08-28 DIAGNOSIS — M85859 Other specified disorders of bone density and structure, unspecified thigh: Secondary | ICD-10-CM | POA: Diagnosis not present

## 2021-08-28 DIAGNOSIS — Z0000 Encounter for general adult medical examination without abnormal findings: Secondary | ICD-10-CM | POA: Diagnosis not present

## 2021-08-28 DIAGNOSIS — J301 Allergic rhinitis due to pollen: Secondary | ICD-10-CM | POA: Diagnosis not present

## 2021-08-28 DIAGNOSIS — E785 Hyperlipidemia, unspecified: Secondary | ICD-10-CM | POA: Diagnosis not present

## 2021-08-28 DIAGNOSIS — Z1389 Encounter for screening for other disorder: Secondary | ICD-10-CM | POA: Diagnosis not present

## 2021-08-28 DIAGNOSIS — I10 Essential (primary) hypertension: Secondary | ICD-10-CM | POA: Diagnosis not present

## 2021-08-28 DIAGNOSIS — Z1211 Encounter for screening for malignant neoplasm of colon: Secondary | ICD-10-CM | POA: Diagnosis not present

## 2021-08-28 DIAGNOSIS — K5790 Diverticulosis of intestine, part unspecified, without perforation or abscess without bleeding: Secondary | ICD-10-CM | POA: Diagnosis not present

## 2021-08-28 DIAGNOSIS — Z1231 Encounter for screening mammogram for malignant neoplasm of breast: Secondary | ICD-10-CM | POA: Diagnosis not present

## 2021-08-28 DIAGNOSIS — J45909 Unspecified asthma, uncomplicated: Secondary | ICD-10-CM | POA: Diagnosis not present

## 2021-08-28 DIAGNOSIS — Z86010 Personal history of colonic polyps: Secondary | ICD-10-CM | POA: Diagnosis not present

## 2021-09-06 ENCOUNTER — Ambulatory Visit
Admission: RE | Admit: 2021-09-06 | Discharge: 2021-09-06 | Disposition: A | Discharge status: Home / Self Care | Payer: Medicare Other | Source: Ambulatory Visit | Attending: Internal Medicine | Admitting: Internal Medicine

## 2021-09-06 DIAGNOSIS — Z780 Asymptomatic menopausal state: Secondary | ICD-10-CM | POA: Diagnosis not present

## 2021-09-06 DIAGNOSIS — M8589 Other specified disorders of bone density and structure, multiple sites: Secondary | ICD-10-CM | POA: Diagnosis not present

## 2021-09-06 DIAGNOSIS — M810 Age-related osteoporosis without current pathological fracture: Secondary | ICD-10-CM | POA: Diagnosis not present

## 2021-09-20 DIAGNOSIS — M8580 Other specified disorders of bone density and structure, unspecified site: Secondary | ICD-10-CM | POA: Diagnosis not present

## 2021-11-18 DIAGNOSIS — L720 Epidermal cyst: Secondary | ICD-10-CM | POA: Diagnosis not present

## 2021-11-18 DIAGNOSIS — L819 Disorder of pigmentation, unspecified: Secondary | ICD-10-CM | POA: Diagnosis not present

## 2021-11-18 DIAGNOSIS — L820 Inflamed seborrheic keratosis: Secondary | ICD-10-CM | POA: Diagnosis not present

## 2021-11-18 DIAGNOSIS — L821 Other seborrheic keratosis: Secondary | ICD-10-CM | POA: Diagnosis not present

## 2022-02-27 DIAGNOSIS — M7918 Myalgia, other site: Secondary | ICD-10-CM | POA: Diagnosis not present

## 2022-02-27 DIAGNOSIS — M810 Age-related osteoporosis without current pathological fracture: Secondary | ICD-10-CM | POA: Diagnosis not present

## 2022-02-27 DIAGNOSIS — I10 Essential (primary) hypertension: Secondary | ICD-10-CM | POA: Diagnosis not present

## 2022-02-27 DIAGNOSIS — N183 Chronic kidney disease, stage 3 unspecified: Secondary | ICD-10-CM | POA: Diagnosis not present

## 2022-02-27 DIAGNOSIS — N1831 Chronic kidney disease, stage 3a: Secondary | ICD-10-CM | POA: Diagnosis not present

## 2022-03-21 DIAGNOSIS — H5211 Myopia, right eye: Secondary | ICD-10-CM | POA: Diagnosis not present

## 2022-03-21 DIAGNOSIS — H524 Presbyopia: Secondary | ICD-10-CM | POA: Diagnosis not present

## 2022-03-21 DIAGNOSIS — H25013 Cortical age-related cataract, bilateral: Secondary | ICD-10-CM | POA: Diagnosis not present

## 2022-03-21 DIAGNOSIS — H2513 Age-related nuclear cataract, bilateral: Secondary | ICD-10-CM | POA: Diagnosis not present

## 2022-04-19 DIAGNOSIS — Z23 Encounter for immunization: Secondary | ICD-10-CM | POA: Diagnosis not present

## 2022-05-07 HISTORY — PX: COLONOSCOPY: SHX174

## 2022-05-19 ENCOUNTER — Other Ambulatory Visit: Payer: Self-pay | Admitting: Internal Medicine

## 2022-05-19 DIAGNOSIS — Z1231 Encounter for screening mammogram for malignant neoplasm of breast: Secondary | ICD-10-CM

## 2022-06-04 DIAGNOSIS — K5730 Diverticulosis of large intestine without perforation or abscess without bleeding: Secondary | ICD-10-CM | POA: Diagnosis not present

## 2022-06-04 DIAGNOSIS — Z09 Encounter for follow-up examination after completed treatment for conditions other than malignant neoplasm: Secondary | ICD-10-CM | POA: Diagnosis not present

## 2022-06-04 DIAGNOSIS — D120 Benign neoplasm of cecum: Secondary | ICD-10-CM | POA: Diagnosis not present

## 2022-06-04 DIAGNOSIS — Z86010 Personal history of colonic polyps: Secondary | ICD-10-CM | POA: Diagnosis not present

## 2022-06-04 DIAGNOSIS — D123 Benign neoplasm of transverse colon: Secondary | ICD-10-CM | POA: Diagnosis not present

## 2022-06-04 DIAGNOSIS — K648 Other hemorrhoids: Secondary | ICD-10-CM | POA: Diagnosis not present

## 2022-06-04 DIAGNOSIS — D122 Benign neoplasm of ascending colon: Secondary | ICD-10-CM | POA: Diagnosis not present

## 2022-06-06 DIAGNOSIS — D123 Benign neoplasm of transverse colon: Secondary | ICD-10-CM | POA: Diagnosis not present

## 2022-06-06 DIAGNOSIS — D120 Benign neoplasm of cecum: Secondary | ICD-10-CM | POA: Diagnosis not present

## 2022-06-06 DIAGNOSIS — D122 Benign neoplasm of ascending colon: Secondary | ICD-10-CM | POA: Diagnosis not present

## 2022-07-04 IMAGING — MG MM DIGITAL SCREENING BILAT W/ TOMO AND CAD
6 of 10 series · 6 of 30 positions shown · non-contrast
Comparison: Previous exam(s).

CLINICAL DATA: Screening.

EXAM:
DIGITAL SCREENING BILATERAL MAMMOGRAM WITH TOMO AND CAD

[L MLO synth-2D (1 of 2)]
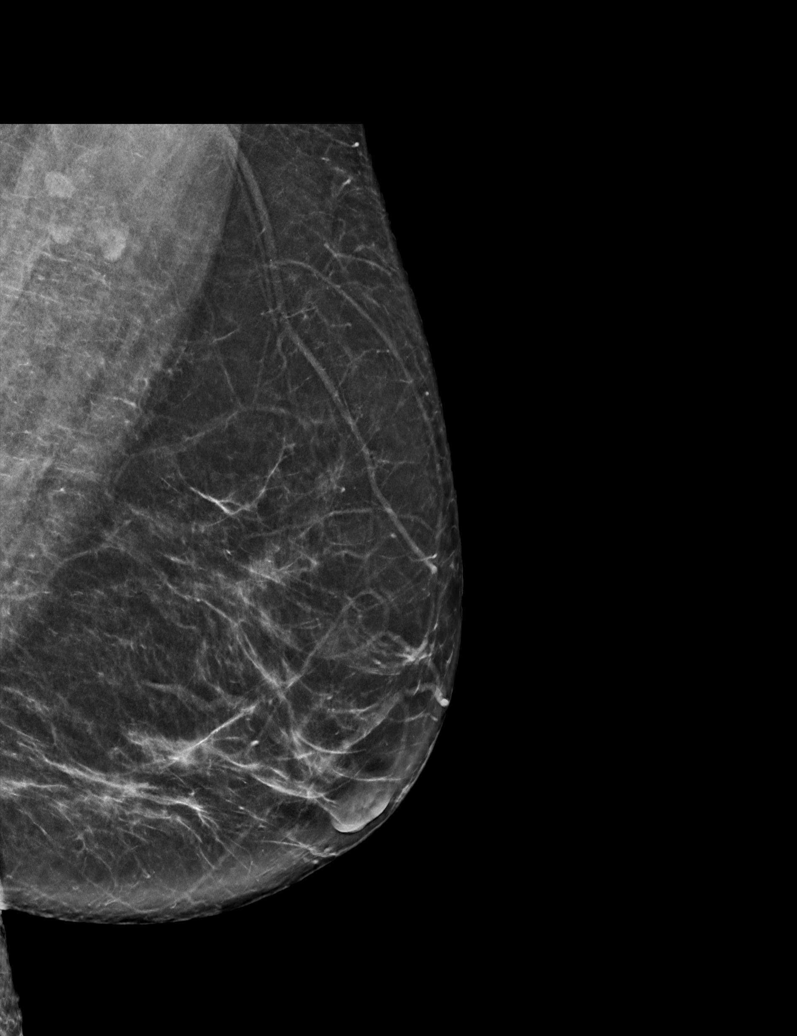

[R CC synth-2D]
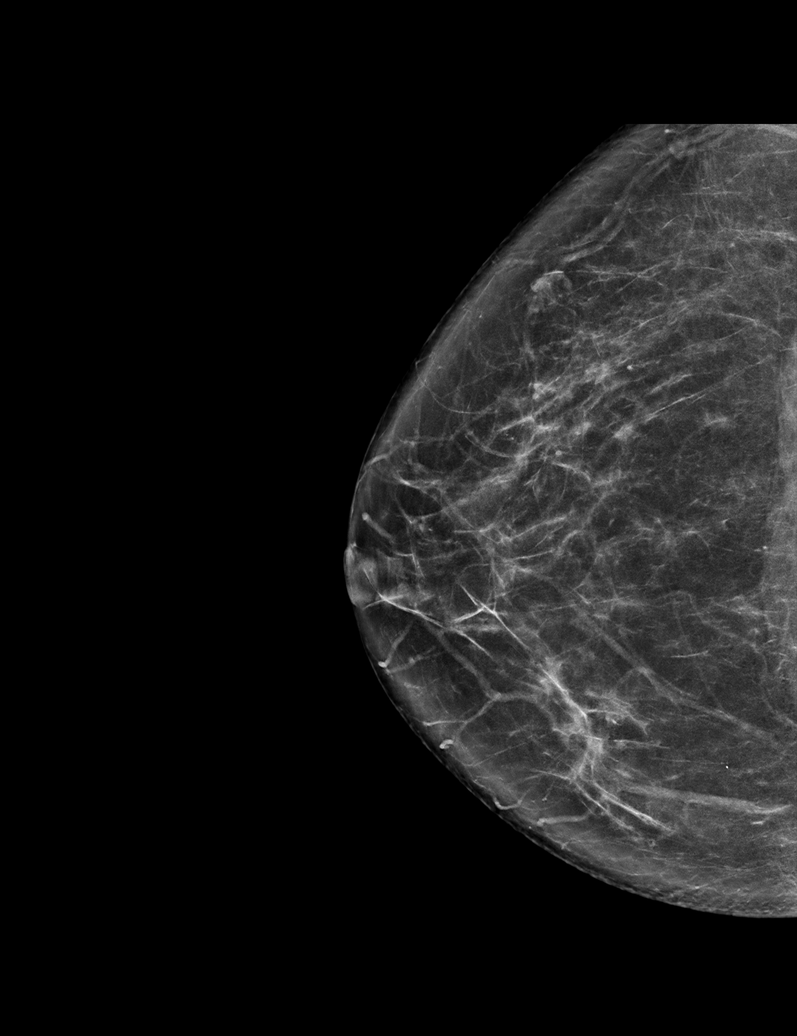

[R MLO synth-2D]
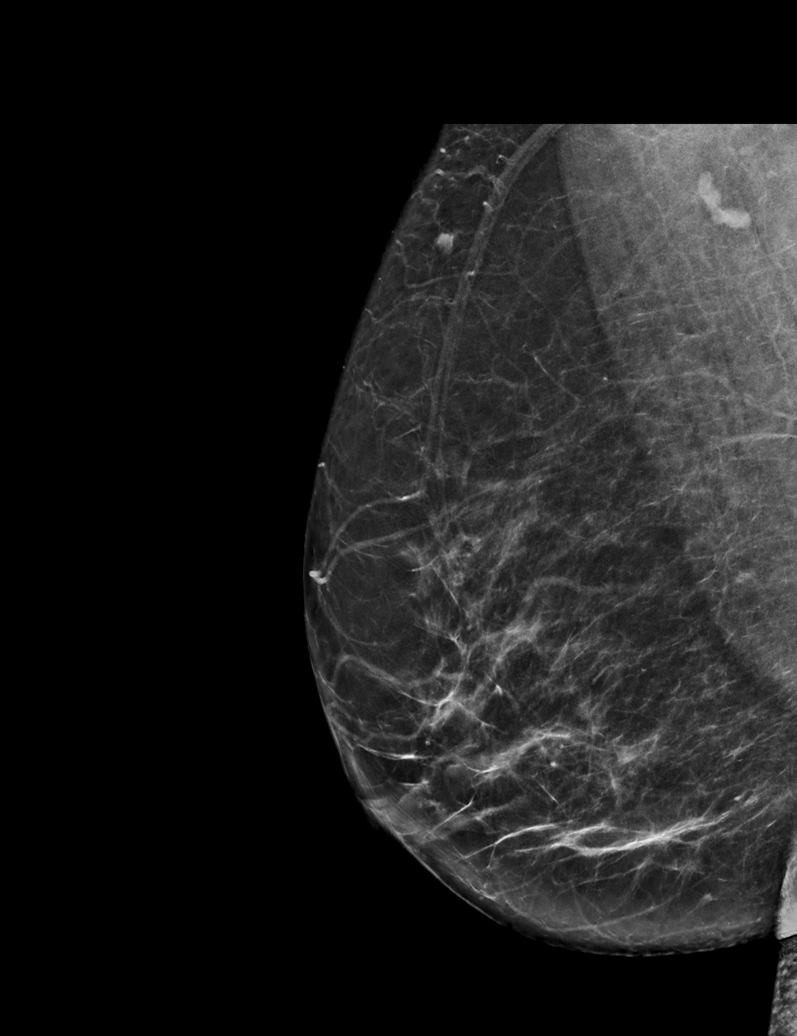

[L CC synth-2D]
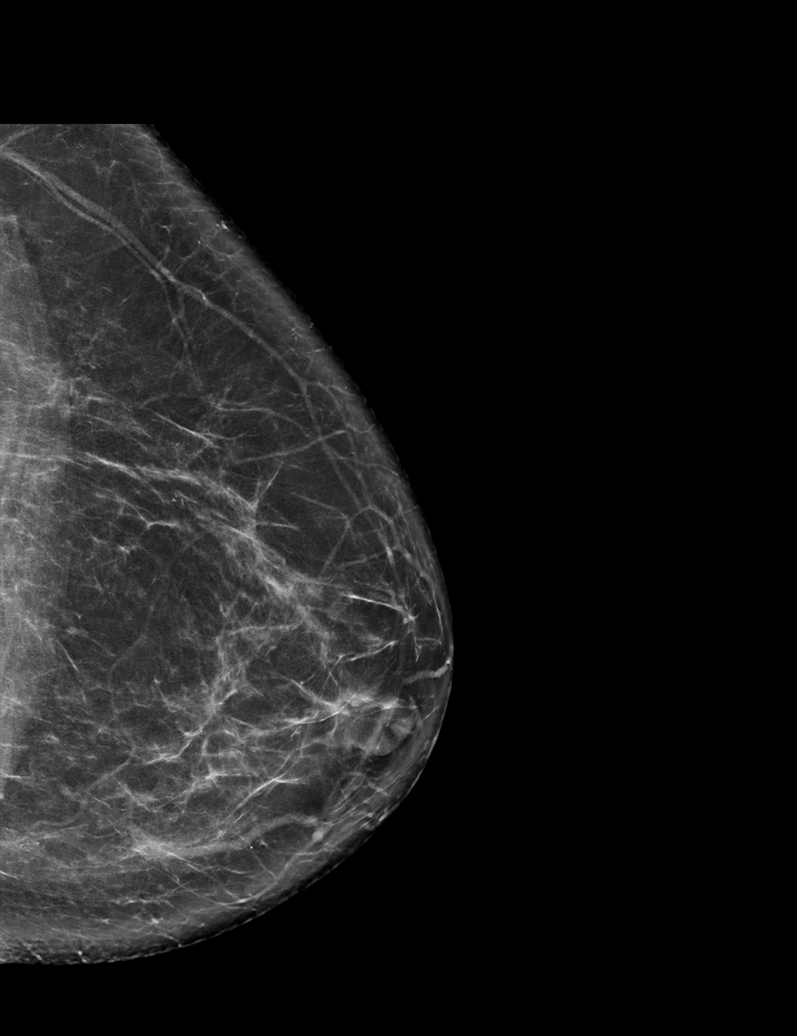

[L MLO synth-2D (2 of 2)]
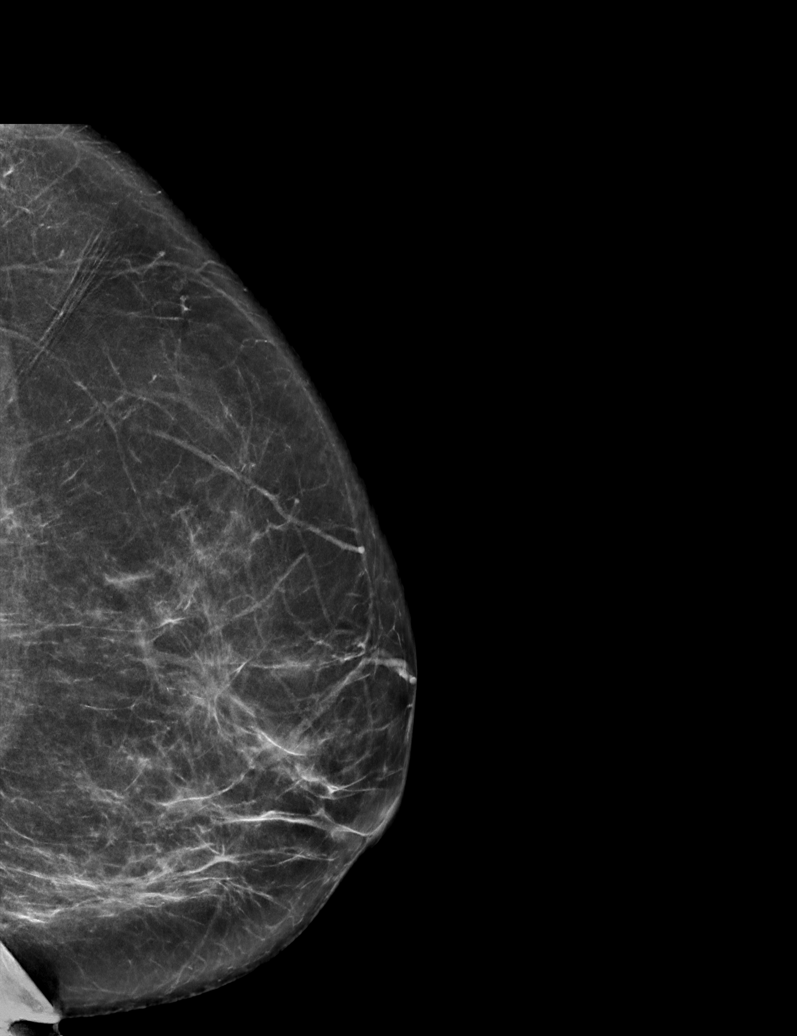

[L MLO tomo · tomo slice 33/66.0]
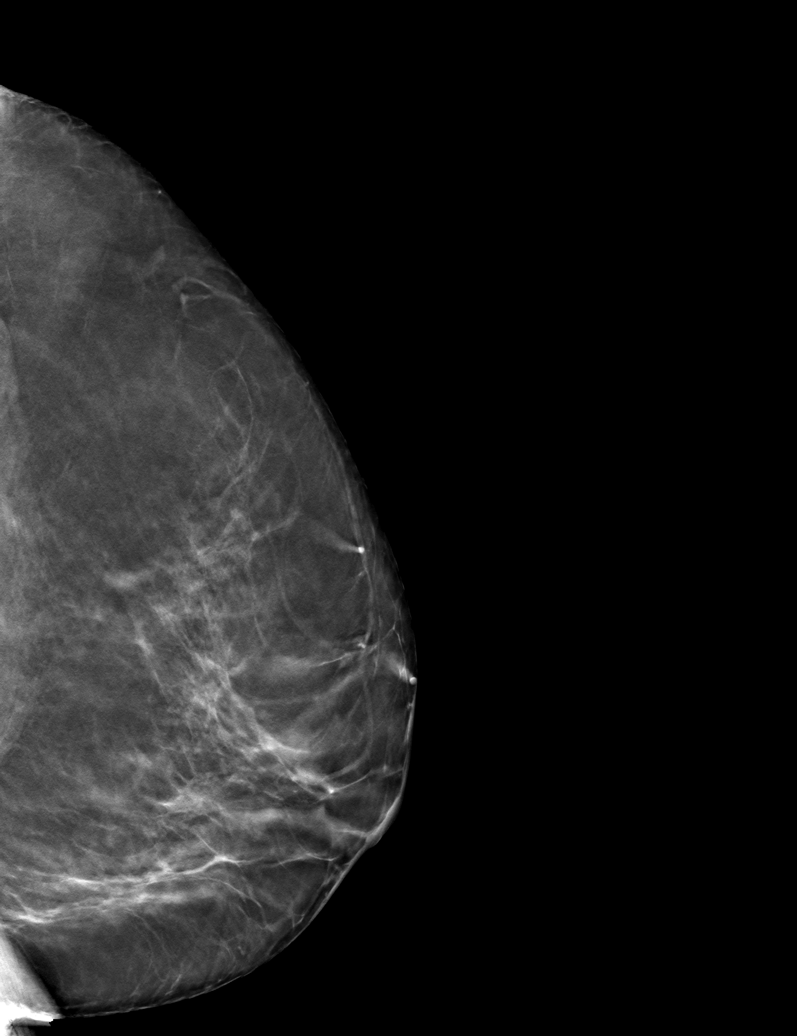

[6 of 30 positions shown; findings below may reference images not displayed]

ACR Breast Density Category b: There are scattered areas of
fibroglandular density.
FINDINGS: There are no findings suspicious for malignancy. The images were
evaluated with computer-aided detection.
IMPRESSION: No mammographic evidence of malignancy. A result letter of this
screening mammogram will be mailed directly to the patient.

RECOMMENDATION:
Screening mammogram in one year. (Code:ZP-7-VX7)

BI-RADS CATEGORY  1: Negative.

## 2022-08-04 ENCOUNTER — Ambulatory Visit
Admission: RE | Admit: 2022-08-04 | Discharge: 2022-08-04 | Disposition: A | Discharge status: Home / Self Care | Payer: Medicare Other | Source: Ambulatory Visit | Attending: Internal Medicine | Admitting: Internal Medicine

## 2022-08-04 DIAGNOSIS — Z1231 Encounter for screening mammogram for malignant neoplasm of breast: Secondary | ICD-10-CM

## 2022-09-15 DIAGNOSIS — M810 Age-related osteoporosis without current pathological fracture: Secondary | ICD-10-CM | POA: Diagnosis not present

## 2022-09-15 DIAGNOSIS — E785 Hyperlipidemia, unspecified: Secondary | ICD-10-CM | POA: Diagnosis not present

## 2022-09-15 DIAGNOSIS — Z8249 Family history of ischemic heart disease and other diseases of the circulatory system: Secondary | ICD-10-CM | POA: Diagnosis not present

## 2022-09-15 DIAGNOSIS — N183 Chronic kidney disease, stage 3 unspecified: Secondary | ICD-10-CM | POA: Diagnosis not present

## 2022-09-15 DIAGNOSIS — Z0000 Encounter for general adult medical examination without abnormal findings: Secondary | ICD-10-CM | POA: Diagnosis not present

## 2022-09-15 DIAGNOSIS — D72819 Decreased white blood cell count, unspecified: Secondary | ICD-10-CM | POA: Diagnosis not present

## 2022-09-15 DIAGNOSIS — I129 Hypertensive chronic kidney disease with stage 1 through stage 4 chronic kidney disease, or unspecified chronic kidney disease: Secondary | ICD-10-CM | POA: Diagnosis not present

## 2022-12-15 ENCOUNTER — Other Ambulatory Visit: Payer: Self-pay | Admitting: Nephrology

## 2022-12-15 DIAGNOSIS — I129 Hypertensive chronic kidney disease with stage 1 through stage 4 chronic kidney disease, or unspecified chronic kidney disease: Secondary | ICD-10-CM

## 2022-12-15 DIAGNOSIS — N2581 Secondary hyperparathyroidism of renal origin: Secondary | ICD-10-CM | POA: Diagnosis not present

## 2022-12-15 DIAGNOSIS — N1831 Chronic kidney disease, stage 3a: Secondary | ICD-10-CM

## 2022-12-15 DIAGNOSIS — E785 Hyperlipidemia, unspecified: Secondary | ICD-10-CM

## 2022-12-15 DIAGNOSIS — D631 Anemia in chronic kidney disease: Secondary | ICD-10-CM | POA: Diagnosis not present

## 2022-12-16 ENCOUNTER — Ambulatory Visit
Admission: RE | Admit: 2022-12-16 | Discharge: 2022-12-16 | Disposition: A | Discharge status: Home / Self Care | Payer: Medicare Other | Source: Ambulatory Visit | Attending: Nephrology | Admitting: Nephrology

## 2022-12-16 DIAGNOSIS — I129 Hypertensive chronic kidney disease with stage 1 through stage 4 chronic kidney disease, or unspecified chronic kidney disease: Secondary | ICD-10-CM

## 2022-12-16 DIAGNOSIS — N189 Chronic kidney disease, unspecified: Secondary | ICD-10-CM | POA: Diagnosis not present

## 2022-12-16 DIAGNOSIS — E785 Hyperlipidemia, unspecified: Secondary | ICD-10-CM

## 2022-12-16 DIAGNOSIS — N1831 Chronic kidney disease, stage 3a: Secondary | ICD-10-CM

## 2023-01-22 DIAGNOSIS — R9389 Abnormal findings on diagnostic imaging of other specified body structures: Secondary | ICD-10-CM | POA: Diagnosis not present

## 2023-02-11 ENCOUNTER — Other Ambulatory Visit: Payer: Self-pay | Admitting: Obstetrics and Gynecology

## 2023-02-11 DIAGNOSIS — Z01818 Encounter for other preprocedural examination: Secondary | ICD-10-CM

## 2023-03-06 ENCOUNTER — Encounter (HOSPITAL_BASED_OUTPATIENT_CLINIC_OR_DEPARTMENT_OTHER): Payer: Self-pay | Admitting: Obstetrics and Gynecology

## 2023-03-10 ENCOUNTER — Encounter (HOSPITAL_BASED_OUTPATIENT_CLINIC_OR_DEPARTMENT_OTHER): Payer: Self-pay | Admitting: Obstetrics and Gynecology

## 2023-03-10 NOTE — Progress Notes (Signed)
Spoke w/ via phone for pre-op interview--- pt Lab needs dos----  cbc, istat, ekg             Lab results------ no COVID test -----patient states asymptomatic no test needed Arrive at ------- 0915 on 03-18-2023  NPO after MN NO Solid Food.  Clear liquids from MN until--- 0815 Med rec completed Medications to take morning of surgery ----- norvasc Diabetic medication ----- n/a Patient instructed no nail polish to be worn day of surgery Patient instructed to bring photo id and insurance card day of surgery Patient aware to have Driver (ride ) / caregiver    for 24 hours after surgery -- husband, Hydrologist Patient Special Instructions ----- n/a Pre-Op special Instructions ----- n/a Patient verbalized understanding of instructions that were given at this phone interview. Patient denies shortness of breath, chest pain, fever, cough at this phone interview.

## 2023-03-15 NOTE — H&P (Signed)
72 y.o. For D&C, hysteroscopy for thickened endo- previously:"GYN [place] for hx of thickened EM; denies any bleeding, not on any hormones; pain resolved/tb//  REpeat US for EM- previously was 6mm in April.  Today Physician interpretation: 6x5x4; EM 6mm still with some anechoic areas now. Multiple small fibroids, normal ovaries./mah. Will need sampling of EM- d/w pt option of EMB in office vs D&C, hysteroscopy. Pt desires to have EMB. "   Past Medical History:  Diagnosis Date   CKD (chronic kidney disease), stage III [facility])    nephrologist--- dr v. Thedore Mins   Dyslipidemia    Full dentures    Hypertension    Osteoporosis    PONV (postoperative nausea and vomiting)    Thickened endometrium    Past Surgical History:  Procedure Laterality Date   BREAST EXCISIONAL BIOPSY Left 2004   approx   COLONOSCOPY  05/2022   [facility]   TUBAL LIGATION Bilateral    yrs ago    Social History   Socioeconomic History   Marital status: Married    Spouse name: Not on file   Number of children: Not on file   Years of education: Not on file   Highest education level: Not on file  Occupational History   Not on file  Tobacco Use   Smoking status: Never   Smokeless tobacco: Never  Vaping Use   Vaping status: Never Used  Substance and Sexual Activity   Alcohol use: Not Currently   Drug use: Never   Sexual activity: Not on file  Other Topics Concern   Not on file  Social History Narrative   Not on file   Social Determinants of Health   Financial Resource Strain: Not on file  Food Insecurity: Not on file  Transportation Needs: Not on file  Physical Activity: Not on file  Stress: Not on file  Social Connections: Not on file  Intimate Partner Violence: Not on file    No current facility-administered medications on file prior to encounter.   Current Outpatient Medications on File Prior to Encounter  Medication Sig Dispense Refill   alendronate (FOSAMAX) 35 MG tablet Take 35 mg by mouth  every 7 (seven) days. Takes Thursday's /   Take with a full glass of water on an empty stomach.     amLODipine (NORVASC) 10 MG tablet Take 10 mg by mouth daily.     Calcium-Magnesium-Vitamin D (CALCIUM 1200+D3 PO) Take 1 tablet by mouth 2 (two) times daily. D3 is 1000 units     rosuvastatin (CRESTOR) 5 MG tablet Take 5 mg by mouth at bedtime.     vitamin E 1000 UNIT capsule Take 2 capsules by mouth daily.      No Known Allergies  Vitals:   03/10/23 1359  Weight: 70.3 kg  Height: 5\' 3"  (1.6 m)    Lungs: clear to ascultation Cor:  RRR Abdomen:  soft, nontender, nondistended. Ex:  no cords, erythema Pelvic:   Vulva: no masses, no atrophy, no lesions Vagina: no tenderness, no erythema, no abnormal vaginal discharge, no vesicle(s) or ulcers, no cystocele, no rectocele Cervix: grossly normal, no discharge, no cervical motion tenderness Uterus: normal size (7), normal shape, midline, no uterine prolapse, non-tender Bladder/Urethra: normal meatus, no urethral discharge, no urethral mass, bladder non distended, Urethra well supported Adnexa/Parametria: no parametrial tenderness, no parametrial mass, no adnexal tenderness, no ovarian mass   A:  For d&c, hysteroscopy for thickened endometrium.   P: P: All risks, benefits and alternatives d/w patient  and she desires to proceed.  Patient has undergone an ERAS protocol SCDs during the operation.   Mallory Gaines

## 2023-03-18 ENCOUNTER — Encounter (HOSPITAL_BASED_OUTPATIENT_CLINIC_OR_DEPARTMENT_OTHER)
Admission: RE | Disposition: A | Discharge status: Home / Self Care | Payer: Self-pay | Source: Home / Self Care | Attending: Obstetrics and Gynecology

## 2023-03-18 ENCOUNTER — Ambulatory Visit (HOSPITAL_BASED_OUTPATIENT_CLINIC_OR_DEPARTMENT_OTHER): Payer: Medicare Other | Admitting: Certified Registered Nurse Anesthetist

## 2023-03-18 ENCOUNTER — Encounter (HOSPITAL_BASED_OUTPATIENT_CLINIC_OR_DEPARTMENT_OTHER): Payer: Self-pay | Admitting: Obstetrics and Gynecology

## 2023-03-18 ENCOUNTER — Other Ambulatory Visit: Payer: Self-pay

## 2023-03-18 ENCOUNTER — Ambulatory Visit (HOSPITAL_BASED_OUTPATIENT_CLINIC_OR_DEPARTMENT_OTHER)
Admission: RE | Admit: 2023-03-18 | Discharge: 2023-03-18 | Disposition: A | Discharge status: Home / Self Care | Payer: Medicare Other | Attending: Obstetrics and Gynecology | Admitting: Obstetrics and Gynecology

## 2023-03-18 DIAGNOSIS — R9389 Abnormal findings on diagnostic imaging of other specified body structures: Secondary | ICD-10-CM

## 2023-03-18 DIAGNOSIS — I129 Hypertensive chronic kidney disease with stage 1 through stage 4 chronic kidney disease, or unspecified chronic kidney disease: Secondary | ICD-10-CM | POA: Insufficient documentation

## 2023-03-18 DIAGNOSIS — Z79899 Other long term (current) drug therapy: Secondary | ICD-10-CM | POA: Insufficient documentation

## 2023-03-18 DIAGNOSIS — Z01818 Encounter for other preprocedural examination: Secondary | ICD-10-CM

## 2023-03-18 DIAGNOSIS — N183 Chronic kidney disease, stage 3 unspecified: Secondary | ICD-10-CM | POA: Diagnosis not present

## 2023-03-18 HISTORY — DX: Abnormal findings on diagnostic imaging of other specified body structures: R93.89

## 2023-03-18 HISTORY — DX: Other specified postprocedural states: Z98.890

## 2023-03-18 HISTORY — DX: Presence of dental prosthetic device (complete) (partial): K08.109

## 2023-03-18 HISTORY — DX: Chronic kidney disease, stage 3 unspecified: N18.30

## 2023-03-18 HISTORY — DX: Hyperlipidemia, unspecified: E78.5

## 2023-03-18 HISTORY — DX: Age-related osteoporosis without current pathological fracture: M81.0

## 2023-03-18 HISTORY — DX: Other specified postprocedural states: R11.2

## 2023-03-18 HISTORY — PX: HYSTEROSCOPY WITH D & C: SHX1775

## 2023-03-18 LAB — POCT I-STAT, CHEM 8
BUN: 16 mg/dL (ref 8–23)
Calcium, Ion: 1.2 mmol/L (ref 1.15–1.40)
Chloride: 108 mmol/L (ref 98–111)
Creatinine, Ser: 1 mg/dL (ref 0.44–1.00)
Glucose, Bld: 90 mg/dL (ref 70–99)
HCT: 44 % (ref 36.0–46.0)
Hemoglobin: 15 g/dL (ref 12.0–15.0)
Potassium: 4.7 mmol/L (ref 3.5–5.1)
Sodium: 143 mmol/L (ref 135–145)
TCO2: 24 mmol/L (ref 22–32)

## 2023-03-18 LAB — CBC
HCT: 43.7 % (ref 36.0–46.0)
Hemoglobin: 13.9 g/dL (ref 12.0–15.0)
MCH: 28.7 pg (ref 26.0–34.0)
MCHC: 31.8 g/dL (ref 30.0–36.0)
MCV: 90.3 fL (ref 80.0–100.0)
Platelets: 251 10*3/uL (ref 150–400)
RBC: 4.84 MIL/uL (ref 3.87–5.11)
RDW: 13.9 % (ref 11.5–15.5)
WBC: 4 10*3/uL (ref 4.0–10.5)
nRBC: 0 % (ref 0.0–0.2)

## 2023-03-18 SURGERY — DILATATION AND CURETTAGE /HYSTEROSCOPY
Anesthesia: General | Site: Uterus

## 2023-03-18 MED ORDER — DROPERIDOL 2.5 MG/ML IJ SOLN: 0.6250 mg | Freq: Once | INTRAMUSCULAR | Status: DC | PRN

## 2023-03-18 MED ORDER — LIDOCAINE 2% (20 MG/ML) 5 ML SYRINGE
INTRAMUSCULAR | Status: DC | PRN
  Administered 2023-03-18: 80 mg via INTRAVENOUS

## 2023-03-18 MED ORDER — FENTANYL CITRATE (PF) 100 MCG/2ML IJ SOLN
INTRAMUSCULAR | Status: AC
  Filled 2023-03-18: qty 2

## 2023-03-18 MED ORDER — LACTATED RINGERS IV SOLN: INTRAVENOUS | Status: DC

## 2023-03-18 MED ORDER — FENTANYL CITRATE (PF) 100 MCG/2ML IJ SOLN
25.0000 ug | INTRAMUSCULAR | Status: DC | PRN
  Administered 2023-03-18 (×3): 25 ug via INTRAVENOUS

## 2023-03-18 MED ORDER — SODIUM CHLORIDE 0.9 % IV SOLN
100.0000 mg | Freq: Once | INTRAVENOUS | Status: AC
  Administered 2023-03-18: 100 mg via INTRAVENOUS
  Filled 2023-03-18: qty 100

## 2023-03-18 MED ORDER — OXYCODONE HCL 5 MG/5ML PO SOLN: 5.0000 mg | Freq: Once | ORAL | Status: DC | PRN

## 2023-03-18 MED ORDER — PROPOFOL 10 MG/ML IV BOLUS
INTRAVENOUS | Status: AC
  Filled 2023-03-18: qty 20

## 2023-03-18 MED ORDER — ONDANSETRON HCL 4 MG/2ML IJ SOLN
INTRAMUSCULAR | Status: AC
  Filled 2023-03-18: qty 2

## 2023-03-18 MED ORDER — FENTANYL CITRATE (PF) 100 MCG/2ML IJ SOLN
INTRAMUSCULAR | Status: DC | PRN
  Administered 2023-03-18: 50 ug via INTRAVENOUS

## 2023-03-18 MED ORDER — OXYCODONE HCL 5 MG PO TABS: 5.0000 mg | ORAL_TABLET | Freq: Once | ORAL | Status: DC | PRN

## 2023-03-18 MED ORDER — ACETAMINOPHEN 500 MG PO TABS
1000.0000 mg | ORAL_TABLET | Freq: Once | ORAL | Status: AC
  Administered 2023-03-18: 1000 mg via ORAL

## 2023-03-18 MED ORDER — ACETAMINOPHEN 500 MG PO TABS
ORAL_TABLET | ORAL | Status: AC
  Filled 2023-03-18: qty 2

## 2023-03-18 MED ORDER — SOD CITRATE-CITRIC ACID 500-334 MG/5ML PO SOLN: 30.0000 mL | ORAL | Status: DC

## 2023-03-18 MED ORDER — ONDANSETRON HCL 4 MG/2ML IJ SOLN
INTRAMUSCULAR | Status: DC | PRN
  Administered 2023-03-18: 4 mg via INTRAVENOUS

## 2023-03-18 MED ORDER — DEXAMETHASONE SODIUM PHOSPHATE 10 MG/ML IJ SOLN
INTRAMUSCULAR | Status: DC | PRN
  Administered 2023-03-18: 5 mg via INTRAVENOUS

## 2023-03-18 MED ORDER — POVIDONE-IODINE 10 % EX SWAB: 2.0000 | Freq: Once | CUTANEOUS | Status: DC

## 2023-03-18 MED ORDER — DEXAMETHASONE SODIUM PHOSPHATE 10 MG/ML IJ SOLN
INTRAMUSCULAR | Status: AC
  Filled 2023-03-18: qty 1

## 2023-03-18 MED ORDER — SODIUM CHLORIDE 0.9 % IR SOLN
Status: DC | PRN
  Administered 2023-03-18: 3000 mL

## 2023-03-18 MED ORDER — SODIUM CHLORIDE 0.9 % IV SOLN: INTRAVENOUS | Status: DC

## 2023-03-18 MED ORDER — PROPOFOL 10 MG/ML IV BOLUS
INTRAVENOUS | Status: DC | PRN
  Administered 2023-03-18: 160 mg via INTRAVENOUS
  Administered 2023-03-18: 40 mg via INTRAVENOUS

## 2023-03-18 MED ORDER — LIDOCAINE HCL (PF) 2 % IJ SOLN
INTRAMUSCULAR | Status: AC
  Filled 2023-03-18: qty 5

## 2023-03-18 SURGICAL SUPPLY — 18 items
CATH ROBINSON RED A/P 16FR (CATHETERS) ×1 IMPLANT
DEVICE MYOSURE LITE (MISCELLANEOUS) IMPLANT
DILATOR CANAL MILEX (MISCELLANEOUS) IMPLANT
DRSG TELFA 3X8 NADH STRL (GAUZE/BANDAGES/DRESSINGS) ×1 IMPLANT
ELECT REM PT RETURN 9FT ADLT (ELECTROSURGICAL)
ELECTRODE REM PT RTRN 9FT ADLT (ELECTROSURGICAL) IMPLANT
GAUZE 4X4 16PLY ~~LOC~~+RFID DBL (SPONGE) ×1 IMPLANT
GLOVE BIO SURGEON STRL SZ7 (GLOVE) ×1 IMPLANT
GOWN STRL REUS W/TWL XL LVL3 (GOWN DISPOSABLE) ×1 IMPLANT
KIT PROCEDURE FLUENT (KITS) ×1 IMPLANT
KIT TURNOVER CYSTO (KITS) ×1 IMPLANT
LOOP CUTTING BIPOLAR 21FR (ELECTRODE) IMPLANT
PACK VAGINAL MINOR WOMEN LF (CUSTOM PROCEDURE TRAY) ×1 IMPLANT
PAD OB MATERNITY 4.3X12.25 (PERSONAL CARE ITEMS) ×1 IMPLANT
SEAL ROD LENS SCOPE MYOSURE (ABLATOR) ×2 IMPLANT
SLEEVE SCD COMPRESS KNEE MED (STOCKING) ×1 IMPLANT
TOWEL OR 17X24 6PK STRL BLUE (TOWEL DISPOSABLE) ×1 IMPLANT
WATER STERILE IRR 500ML POUR (IV SOLUTION) ×1 IMPLANT

## 2023-03-18 NOTE — Brief Op Note (Signed)
03/18/2023  11:37 AM  PATIENT:  Mallory Gaines  72 y.o. female  PRE-OPERATIVE DIAGNOSIS:  abnormal findings on diagnostic imaging  POST-OPERATIVE DIAGNOSIS:  abnormal findings on diagnostic imaging  PROCEDURE:  Procedure(s) with comments: DILATATION AND CURETTAGE /HYSTEROSCOPY (N/A) - per dr Henderson Cloud, requests tiniest of dilators, normal scope  SURGEON:  Surgeons and Role:    * Carrington Clamp, MD - Primary  ANESTHESIA:   general  EBL:  25 mL   LOCAL MEDICATIONS USED:  NONE  SPECIMEN:  Source of Specimen:  uterine currettings  DISPOSITION OF SPECIMEN:  PATHOLOGY  COUNTS:  YES  TOURNIQUET:  * No tourniquets in log *  DICTATION: .Note written in EPIC  PLAN OF CARE: Discharge to home after PACU  PATIENT DISPOSITION:  PACU - hemodynamically stable.   Delay start of Pharmacological VTE agent (>24hrs) due to surgical blood loss or risk of bleeding: not applicable  Findings: Hysteroscopic deficit was 795 cc.  Normal endometrial cavity without polyps.  Complications: multiple perforations but no other complications, minimal bleeding.   Medications: none  Technique:  After adequate anesthesia was achieved, the patient was prepped and draped in the usual sterile fashion.  The speculum was placed in the vagina and the cervix stabilized with a single-tooth tenaculum.  The cervix was so stenotic that tear duct dilators were used at first to get into os.  Once the cervix was dilated up with hagar dilators and the hysteroscope passed inside; the first perforation was posterior cervix and a small fibroid was noted.  The scope was then maneuvered to the endometrial cavity.  The above findings were noted.  Sharp curettage was tinable to be performed blindly so the myosure lite passed into the cavity.  A second puncture was done with the  myosure bluntly but this was retracted and then used inside the uterine cavity under scope guidance to obtain tissue.  The uterine curettings sent to  path.  All instruments were removed from the vagina.  The patient tolerated the procedure well.  Labs may be done in PACU for large deficit.  The patient will also receive doxycycline to prevent infection secondary perforation.   Mariano Doshi A

## 2023-03-18 NOTE — Progress Notes (Signed)
Ambulated to bathroom and voided without difficulty. Very small amt of blood noted

## 2023-03-18 NOTE — Anesthesia Postprocedure Evaluation (Signed)
Anesthesia Post Note  Patient: Mallory Gaines  Procedure(s) Performed: DILATATION AND CURETTAGE /HYSTEROSCOPY (Uterus)     Patient location during evaluation: PACU Anesthesia Type: General Level of consciousness: awake and alert Pain management: pain level controlled Vital Signs Assessment: post-procedure vital signs reviewed and stable Respiratory status: spontaneous breathing, nonlabored ventilation and respiratory function stable Cardiovascular status: blood pressure returned to baseline Postop Assessment: no apparent nausea or vomiting Anesthetic complications: no   No notable events documented.  Last Vitals:  Vitals:   03/18/23 1245 03/18/23 1300  BP: 114/63 113/67  Pulse: (!) 50 (!) 48  Resp: 12 17  Temp: (!) 36.4 C   SpO2: 93% 97%    Last Pain:  Vitals:   03/18/23 1300  TempSrc:   PainSc: 2                  Shanda Howells

## 2023-03-18 NOTE — Anesthesia Procedure Notes (Signed)
Procedure Name: LMA Insertion Date/Time: 03/18/2023 11:14 AM  Performed by: Emberlie Gotcher D, CRNAPre-anesthesia Checklist: Patient identified, Emergency Drugs available, Suction available and Patient being monitored Patient Re-evaluated:Patient Re-evaluated prior to induction Oxygen Delivery Method: Circle system utilized Preoxygenation: Pre-oxygenation with 100% oxygen Induction Type: IV induction Ventilation: Mask ventilation without difficulty LMA: LMA inserted LMA Size: 4.0 Tube type: Oral Number of attempts: 1 Placement Confirmation: positive ETCO2 and breath sounds checked- equal and bilateral Tube secured with: Tape Dental Injury: Teeth and Oropharynx as per pre-operative assessment

## 2023-03-18 NOTE — Transfer of Care (Signed)
Immediate Anesthesia Transfer of Care Note  Patient: Mallory Gaines  Procedure(s) Performed: DILATATION AND CURETTAGE /HYSTEROSCOPY (Uterus)  Patient Location: PACU  Anesthesia Type:General  Level of Consciousness: awake, alert , and patient cooperative  Airway & Oxygen Therapy: Patient Spontanous Breathing and Patient connected to face mask oxygen  Post-op Assessment: Report given to RN and Post -op Vital signs reviewed and stable  Post vital signs: Reviewed and stable  Last Vitals:  Vitals Value Taken Time  BP 124/67 03/18/23 1148  Temp    Pulse 59 03/18/23 1152  Resp 15 03/18/23 1152  SpO2 100 % 03/18/23 1152  Vitals shown include unfiled device data.  Last Pain:  Vitals:   03/18/23 1001  TempSrc: Oral  PainSc: 0-No pain      Patients Stated Pain Goal: 3 (03/18/23 1001)  Complications: No notable events documented.

## 2023-03-18 NOTE — Progress Notes (Signed)
There has been no change in the patients history, status or exam since the history and physical.  Vitals:   03/10/23 1359  Weight: 70.3 kg  Height: 5\' 3"  (1.6 m)    No results found for this or any previous visit (from the past 72 hour(s)).  Mallory Gaines

## 2023-03-18 NOTE — Discharge Instructions (Addendum)
No acetaminophen/Tylenol until after 4:00pm today if needed for pain.     Post Anesthesia Home Care Instructions  Activity: Get plenty of rest for the remainder of the day. A responsible individual must stay with you for 24 hours following the procedure.  For the next 24 hours, DO NOT: -Drive a car -Advertising copywriter -Drink alcoholic beverages -Take any medication unless instructed by your physician -Make any legal decisions or sign important papers.  Meals: Start with liquid foods such as gelatin or soup. Progress to regular foods as tolerated. Avoid greasy, spicy, heavy foods. If nausea and/or vomiting occur, drink only clear liquids until the nausea and/or vomiting subsides. Call your physician if vomiting continues.  Special Instructions/Symptoms: Your throat may feel dry or sore from the anesthesia or the breathing tube placed in your throat during surgery. If this causes discomfort, gargle with warm salt water. The discomfort should disappear within 24 hours.

## 2023-03-18 NOTE — Anesthesia Preprocedure Evaluation (Addendum)
Anesthesia Evaluation  Patient identified by MRN, date of birth, ID band Patient awake    Reviewed: Allergy & Precautions, NPO status , Patient's Chart, lab work & pertinent test results  History of Anesthesia Complications (+) PONV and history of anesthetic complications  Airway Mallampati: II  TM Distance: >3 FB Neck ROM: Full    Dental  (+) Edentulous Upper, Edentulous Lower   Pulmonary neg pulmonary ROS   Pulmonary exam normal        Cardiovascular hypertension, Pt. on medications Normal cardiovascular exam     Neuro/Psych negative neurological ROS     GI/Hepatic negative GI ROS, Neg liver ROS,,,  Endo/Other  negative endocrine ROS    Renal/GU Renal InsufficiencyRenal disease     Musculoskeletal negative musculoskeletal ROS (+)    Abdominal   Peds  Hematology negative hematology ROS (+)   Anesthesia Other Findings Day of surgery medications reviewed with patient.  Reproductive/Obstetrics                              Anesthesia Physical Anesthesia Plan  ASA: 2  Anesthesia Plan: General   Post-op Pain Management: Tylenol PO (pre-op)*   Induction: Intravenous  PONV Risk Score and Plan: 4 or greater and Treatment may vary due to age or medical condition, Dexamethasone and Ondansetron  Airway Management Planned: LMA  Additional Equipment: None  Intra-op Plan:   Post-operative Plan: Extubation in OR  Informed Consent: I have reviewed the patients History and Physical, chart, labs and discussed the procedure including the risks, benefits and alternatives for the proposed anesthesia with the patient or authorized representative who has indicated his/her understanding and acceptance.     Dental advisory given  Plan Discussed with: CRNA  Anesthesia Plan Comments:         Anesthesia Quick Evaluation

## 2023-03-19 ENCOUNTER — Encounter (HOSPITAL_BASED_OUTPATIENT_CLINIC_OR_DEPARTMENT_OTHER): Payer: Self-pay | Admitting: Obstetrics and Gynecology

## 2023-03-19 LAB — SURGICAL PATHOLOGY

## 2023-03-24 DIAGNOSIS — H5213 Myopia, bilateral: Secondary | ICD-10-CM | POA: Diagnosis not present

## 2023-03-24 DIAGNOSIS — H25013 Cortical age-related cataract, bilateral: Secondary | ICD-10-CM | POA: Diagnosis not present

## 2023-03-24 DIAGNOSIS — H524 Presbyopia: Secondary | ICD-10-CM | POA: Diagnosis not present

## 2023-03-24 DIAGNOSIS — H2513 Age-related nuclear cataract, bilateral: Secondary | ICD-10-CM | POA: Diagnosis not present

## 2023-03-24 DIAGNOSIS — H5211 Myopia, right eye: Secondary | ICD-10-CM | POA: Diagnosis not present

## 2023-03-26 NOTE — Op Note (Signed)
03/18/2023  11:37 AM  PATIENT:  Mallory Gaines  72 y.o. female  PRE-OPERATIVE DIAGNOSIS:  abnormal findings on diagnostic imaging  POST-OPERATIVE DIAGNOSIS:  abnormal findings on diagnostic imaging  PROCEDURE:  Procedure(s) with comments: DILATATION AND CURETTAGE /HYSTEROSCOPY (N/A) - per dr Henderson Cloud, requests tiniest of dilators, normal scope  SURGEON:  Surgeons and Role:    * Carrington Clamp, MD - Primary  ANESTHESIA:   general  EBL:  25 mL   LOCAL MEDICATIONS USED:  NONE  SPECIMEN:  Source of Specimen:  uterine currettings  DISPOSITION OF SPECIMEN:  PATHOLOGY  COUNTS:  YES  TOURNIQUET:  * No tourniquets in log *  DICTATION: .Note written in EPIC  PLAN OF CARE: Discharge to home after PACU  PATIENT DISPOSITION:  PACU - hemodynamically stable.   Delay start of Pharmacological VTE agent (>24hrs) due to surgical blood loss or risk of bleeding: not applicable  Findings: Hysteroscopic deficit was 795 cc.  Normal endometrial cavity without polyps.  Complications: multiple perforations but no other complications, minimal bleeding.   Medications: none  Technique:  After adequate anesthesia was achieved, the patient was prepped and draped in the usual sterile fashion.  The speculum was placed in the vagina and the cervix stabilized with a single-tooth tenaculum.  The cervix was so stenotic that tear duct dilators were used at first to get into os.  Once the cervix was dilated up with hagar dilators and the hysteroscope passed inside; the first perforation was posterior cervix and a small fibroid was noted.  The scope was then maneuvered to the endometrial cavity.  The above findings were noted.  Sharp curettage was tinable to be performed blindly so the myosure lite passed into the cavity.  A second puncture was done with the  myosure bluntly but this was retracted and then used inside the uterine cavity under scope guidance to obtain tissue.  The uterine curettings sent to  path.  All instruments were removed from the vagina.  The patient tolerated the procedure well.  Labs may be done in PACU for large deficit.  The patient will also receive doxycycline to prevent infection secondary perforation.   Elowyn Raupp A

## 2023-04-15 DIAGNOSIS — M810 Age-related osteoporosis without current pathological fracture: Secondary | ICD-10-CM | POA: Diagnosis not present

## 2023-04-15 DIAGNOSIS — J45909 Unspecified asthma, uncomplicated: Secondary | ICD-10-CM | POA: Diagnosis not present

## 2023-04-15 DIAGNOSIS — Z23 Encounter for immunization: Secondary | ICD-10-CM | POA: Diagnosis not present

## 2023-04-15 DIAGNOSIS — E785 Hyperlipidemia, unspecified: Secondary | ICD-10-CM | POA: Diagnosis not present

## 2023-04-15 DIAGNOSIS — I10 Essential (primary) hypertension: Secondary | ICD-10-CM | POA: Diagnosis not present

## 2023-04-15 DIAGNOSIS — N1831 Chronic kidney disease, stage 3a: Secondary | ICD-10-CM | POA: Diagnosis not present

## 2023-07-02 ENCOUNTER — Other Ambulatory Visit: Payer: Self-pay | Admitting: Obstetrics and Gynecology

## 2023-07-02 DIAGNOSIS — Z1231 Encounter for screening mammogram for malignant neoplasm of breast: Secondary | ICD-10-CM

## 2023-07-13 DIAGNOSIS — N6321 Unspecified lump in the left breast, upper outer quadrant: Secondary | ICD-10-CM | POA: Diagnosis not present

## 2023-07-14 ENCOUNTER — Other Ambulatory Visit: Payer: Self-pay | Admitting: Obstetrics and Gynecology

## 2023-07-14 DIAGNOSIS — N6321 Unspecified lump in the left breast, upper outer quadrant: Secondary | ICD-10-CM

## 2023-07-28 ENCOUNTER — Other Ambulatory Visit: Payer: Medicare Other

## 2023-08-06 ENCOUNTER — Other Ambulatory Visit: Payer: Self-pay | Admitting: Obstetrics and Gynecology

## 2023-08-06 ENCOUNTER — Ambulatory Visit
Admission: RE | Admit: 2023-08-06 | Discharge: 2023-08-06 | Disposition: A | Discharge status: Home / Self Care | Payer: Medicare Other | Source: Ambulatory Visit | Attending: Obstetrics and Gynecology | Admitting: Obstetrics and Gynecology

## 2023-08-06 DIAGNOSIS — Z1231 Encounter for screening mammogram for malignant neoplasm of breast: Secondary | ICD-10-CM

## 2023-08-06 DIAGNOSIS — N6310 Unspecified lump in the right breast, unspecified quadrant: Secondary | ICD-10-CM

## 2023-08-06 DIAGNOSIS — R928 Other abnormal and inconclusive findings on diagnostic imaging of breast: Secondary | ICD-10-CM

## 2023-08-06 DIAGNOSIS — N6312 Unspecified lump in the right breast, upper inner quadrant: Secondary | ICD-10-CM | POA: Diagnosis not present

## 2023-08-06 DIAGNOSIS — N644 Mastodynia: Secondary | ICD-10-CM | POA: Diagnosis not present

## 2023-08-06 DIAGNOSIS — N6321 Unspecified lump in the left breast, upper outer quadrant: Secondary | ICD-10-CM

## 2023-08-12 ENCOUNTER — Ambulatory Visit
Admission: RE | Admit: 2023-08-12 | Discharge: 2023-08-12 | Disposition: A | Discharge status: Home / Self Care | Payer: Medicare Other | Source: Ambulatory Visit | Attending: Obstetrics and Gynecology | Admitting: Obstetrics and Gynecology

## 2023-08-12 DIAGNOSIS — N6011 Diffuse cystic mastopathy of right breast: Secondary | ICD-10-CM | POA: Diagnosis not present

## 2023-08-12 DIAGNOSIS — N6310 Unspecified lump in the right breast, unspecified quadrant: Secondary | ICD-10-CM

## 2023-08-12 DIAGNOSIS — N6312 Unspecified lump in the right breast, upper inner quadrant: Secondary | ICD-10-CM | POA: Diagnosis not present

## 2023-08-12 DIAGNOSIS — R928 Other abnormal and inconclusive findings on diagnostic imaging of breast: Secondary | ICD-10-CM

## 2023-08-12 HISTORY — PX: BREAST BIOPSY: SHX20

## 2023-08-14 LAB — SURGICAL PATHOLOGY

## 2023-08-28 ENCOUNTER — Ambulatory Visit: Payer: Self-pay | Admitting: Surgery

## 2023-08-28 DIAGNOSIS — N6310 Unspecified lump in the right breast, unspecified quadrant: Secondary | ICD-10-CM

## 2023-08-28 DIAGNOSIS — N6312 Unspecified lump in the right breast, upper inner quadrant: Secondary | ICD-10-CM | POA: Diagnosis not present

## 2023-08-28 NOTE — H&P (View-Only) (Signed)
 Subjective   Chief Complaint: New Consultation     History of Present Illness: Mallory Gaines is a 73 y.o. female who is seen today as an office consultation at the request of Dr. Henderson Cloud for evaluation of New Consultation .    This is a 73 year old female with a history of breast cancer in 2 sisters as well as 1 niece.  Recently she underwent routine screening mammogram that detected a mass in the right breast at 1:00.  Ultrasound revealed a 6 x 4 x 5 mm mass located at 1:00, 4 cm from the nipple.  She underwent biopsy of this area that showed benign breast tissue.  This was felt to be discordant so the patient is referred to [place] for excisional biopsy.  The patient had a previous needle biopsy of a left breast mass but that was benign.  She does have chronic intermittent bilateral breast tenderness.   Review of Systems: A complete review of systems was obtained from the patient.  I have reviewed this information and discussed as appropriate with the patient.  See HPI as well for other ROS.  ROS    Medical History: Past Medical History:  Diagnosis Date   Chronic kidney disease    Hypertension     Patient Active Problem List  Diagnosis   Breast lump   Osteoporosis   Vaginal discharge    Past Surgical History:  Procedure Laterality Date   LAPAROSCOPIC TUBAL LIGATION       No Known Allergies  Current Outpatient Medications on File Prior to Visit  Medication Sig Dispense Refill   amLODIPine (NORVASC) 10 MG tablet Take 10 mg by mouth once daily     rosuvastatin (CRESTOR) 5 MG tablet Take 5 mg by mouth at bedtime     vitamin E 1000 UNIT capsule Take 2 capsules by mouth once daily     alendronate (FOSAMAX) 35 MG tablet Take 35 mg by mouth (Patient not taking: Reported on 08/28/2023)     No current facility-administered medications on file prior to visit.    Family History  Problem Relation Age of Onset   High blood pressure (Hypertension) Mother    Diabetes Mother     Coronary Artery Disease (Blocked arteries around heart) Father    Breast cancer Sister    High blood pressure (Hypertension) Sister    High blood pressure (Hypertension) Brother    Diabetes Brother      Social History   Tobacco Use  Smoking Status Never  Smokeless Tobacco Never     Social History   Socioeconomic History   Marital status: Unknown  Tobacco Use   Smoking status: Never   Smokeless tobacco: Never  Substance and Sexual Activity   Alcohol use: Never   Drug use: Never   Social Drivers of Health   Housing Stability: Unknown (08/28/2023)   Housing Stability Vital Sign    Homeless in the Last Year: No    Objective:    Vitals:   08/28/23 0953  BP: 118/78  Pulse: 69  Temp: 36.2 C (97.2 F)  SpO2: 96%  Weight: 70.9 kg (156 lb 3.2 oz)  Height: 158.8 cm (5' 2.5")  PainSc: 1   PainLoc: Breast    Body mass index is 28.11 kg/m.  Physical Exam   Constitutional:  WDWN in NAD, conversant, no obvious deformities; lying in bed comfortably Eyes:  Pupils equal, round; sclera anicteric; moist conjunctiva; no lid lag HENT:  Oral mucosa moist; good dentition  Neck:  No masses palpated, trachea midline; no thyromegaly Lungs:  CTA bilaterally; normal respiratory effort Breasts:  symmetric, no nipple changes; no palpable masses or lymphadenopathy on either side CV:  Regular rate and rhythm; no murmurs; extremities well-perfused with no edema Abd:  +bowel sounds, soft, non-tender, no palpable organomegaly; no palpable hernias Musc: Normal gait; no apparent clubbing or cyanosis in extremities Lymphatic:  No palpable cervical or axillary lymphadenopathy Skin:  Warm, dry; no sign of jaundice Psychiatric - alert and oriented x 4; calm mood and affect   Labs, Imaging and Diagnostic Testing: CLINICAL DATA:  Patient presents for diffuse left breast pain.   EXAM: DIGITAL DIAGNOSTIC BILATERAL MAMMOGRAM WITH TOMOSYNTHESIS AND CAD; ULTRASOUND RIGHT BREAST LIMITED    TECHNIQUE: Bilateral digital diagnostic mammography and breast tomosynthesis was performed. The images were evaluated with computer-aided detection. ; Targeted ultrasound examination of the right breast was performed   COMPARISON:  Previous exam(s).   ACR Breast Density Category b: There are scattered areas of fibroglandular density.   FINDINGS: Normal appearance of the left breast. Within the upper inner right breast demonstrated best on the spot MLO image is a focal small irregular mass with associated distortion. No additional findings within the right breast.   Targeted ultrasound is performed, showing a 6 x 4 x 5 mm irregular hypoechoic mass right breast 1 o'clock position 4 cm from the nipple. No right axillary adenopathy.   IMPRESSION: Suspicious right breast mass 1 o'clock position.   RECOMMENDATION: Ultrasound-guided core needle biopsy right breast mass 1 o'clock position.   I have discussed the findings and recommendations with the patient. If applicable, a reminder letter will be sent to the patient regarding the next appointment.   BI-RADS CATEGORY  4: Suspicious.     Electronically Signed   By: Annia Belt M.D.   On: 08/06/2023 10:26  Assessment and Plan:  Diagnoses and all orders for this visit:  Mass of upper inner quadrant of right breast    Recommend right breast radioactive seed localized excisional biopsy.  The surgical procedure has been discussed with the patient.  Potential risks, benefits, alternative treatments, and expected outcomes have been explained.  All of the patient's questions at this time have been answered.  The likelihood of reaching the patient's treatment goal is good.  The patient understands the proposed surgical procedure and wishes to proceed.    Webb Weed Delbert Harness, MD  08/28/2023 10:11 AM

## 2023-08-28 NOTE — H&P (Signed)
Subjective   Chief Complaint: New Consultation     History of Present Illness: Mallory Gaines is a 73 y.o. female who is seen today as an office consultation at the request of Dr. Henderson Cloud for evaluation of New Consultation .    This is a 73 year old female with a history of breast cancer in 2 sisters as well as 1 niece.  Recently she underwent routine screening mammogram that detected a mass in the right breast at 1:00.  Ultrasound revealed a 6 x 4 x 5 mm mass located at 1:00, 4 cm from the nipple.  She underwent biopsy of this area that showed benign breast tissue.  This was felt to be discordant so the patient is referred to [place] for excisional biopsy.  The patient had a previous needle biopsy of a left breast mass but that was benign.  She does have chronic intermittent bilateral breast tenderness.   Review of Systems: A complete review of systems was obtained from the patient.  I have reviewed this information and discussed as appropriate with the patient.  See HPI as well for other ROS.  ROS    Medical History: Past Medical History:  Diagnosis Date   Chronic kidney disease    Hypertension     Patient Active Problem List  Diagnosis   Breast lump   Osteoporosis   Vaginal discharge    Past Surgical History:  Procedure Laterality Date   LAPAROSCOPIC TUBAL LIGATION       No Known Allergies  Current Outpatient Medications on File Prior to Visit  Medication Sig Dispense Refill   amLODIPine (NORVASC) 10 MG tablet Take 10 mg by mouth once daily     rosuvastatin (CRESTOR) 5 MG tablet Take 5 mg by mouth at bedtime     vitamin E 1000 UNIT capsule Take 2 capsules by mouth once daily     alendronate (FOSAMAX) 35 MG tablet Take 35 mg by mouth (Patient not taking: Reported on 08/28/2023)     No current facility-administered medications on file prior to visit.    Family History  Problem Relation Age of Onset   High blood pressure (Hypertension) Mother    Diabetes Mother     Coronary Artery Disease (Blocked arteries around heart) Father    Breast cancer Sister    High blood pressure (Hypertension) Sister    High blood pressure (Hypertension) Brother    Diabetes Brother      Social History   Tobacco Use  Smoking Status Never  Smokeless Tobacco Never     Social History   Socioeconomic History   Marital status: Unknown  Tobacco Use   Smoking status: Never   Smokeless tobacco: Never  Substance and Sexual Activity   Alcohol use: Never   Drug use: Never   Social Drivers of Health   Housing Stability: Unknown (08/28/2023)   Housing Stability Vital Sign    Homeless in the Last Year: No    Objective:    Vitals:   08/28/23 0953  BP: 118/78  Pulse: 69  Temp: 36.2 C (97.2 F)  SpO2: 96%  Weight: 70.9 kg (156 lb 3.2 oz)  Height: 158.8 cm (5' 2.5")  PainSc: 1   PainLoc: Breast    Body mass index is 28.11 kg/m.  Physical Exam   Constitutional:  WDWN in NAD, conversant, no obvious deformities; lying in bed comfortably Eyes:  Pupils equal, round; sclera anicteric; moist conjunctiva; no lid lag HENT:  Oral mucosa moist; good dentition  Neck:  No masses palpated, trachea midline; no thyromegaly Lungs:  CTA bilaterally; normal respiratory effort Breasts:  symmetric, no nipple changes; no palpable masses or lymphadenopathy on either side CV:  Regular rate and rhythm; no murmurs; extremities well-perfused with no edema Abd:  +bowel sounds, soft, non-tender, no palpable organomegaly; no palpable hernias Musc: Normal gait; no apparent clubbing or cyanosis in extremities Lymphatic:  No palpable cervical or axillary lymphadenopathy Skin:  Warm, dry; no sign of jaundice Psychiatric - alert and oriented x 4; calm mood and affect   Labs, Imaging and Diagnostic Testing: CLINICAL DATA:  Patient presents for diffuse left breast pain.   EXAM: DIGITAL DIAGNOSTIC BILATERAL MAMMOGRAM WITH TOMOSYNTHESIS AND CAD; ULTRASOUND RIGHT BREAST LIMITED    TECHNIQUE: Bilateral digital diagnostic mammography and breast tomosynthesis was performed. The images were evaluated with computer-aided detection. ; Targeted ultrasound examination of the right breast was performed   COMPARISON:  Previous exam(s).   ACR Breast Density Category b: There are scattered areas of fibroglandular density.   FINDINGS: Normal appearance of the left breast. Within the upper inner right breast demonstrated best on the spot MLO image is a focal small irregular mass with associated distortion. No additional findings within the right breast.   Targeted ultrasound is performed, showing a 6 x 4 x 5 mm irregular hypoechoic mass right breast 1 o'clock position 4 cm from the nipple. No right axillary adenopathy.   IMPRESSION: Suspicious right breast mass 1 o'clock position.   RECOMMENDATION: Ultrasound-guided core needle biopsy right breast mass 1 o'clock position.   I have discussed the findings and recommendations with the patient. If applicable, a reminder letter will be sent to the patient regarding the next appointment.   BI-RADS CATEGORY  4: Suspicious.     Electronically Signed   By: Annia Belt M.D.   On: 08/06/2023 10:26  Assessment and Plan:  Diagnoses and all orders for this visit:  Mass of upper inner quadrant of right breast    Recommend right breast radioactive seed localized excisional biopsy.  The surgical procedure has been discussed with the patient.  Potential risks, benefits, alternative treatments, and expected outcomes have been explained.  All of the patient's questions at this time have been answered.  The likelihood of reaching the patient's treatment goal is good.  The patient understands the proposed surgical procedure and wishes to proceed.    Webb Weed Delbert Harness, MD  08/28/2023 10:11 AM

## 2023-08-31 ENCOUNTER — Other Ambulatory Visit: Payer: Self-pay | Admitting: Surgery

## 2023-08-31 DIAGNOSIS — N6310 Unspecified lump in the right breast, unspecified quadrant: Secondary | ICD-10-CM

## 2023-09-03 ENCOUNTER — Encounter (HOSPITAL_BASED_OUTPATIENT_CLINIC_OR_DEPARTMENT_OTHER): Payer: Self-pay | Admitting: Surgery

## 2023-09-08 MED ORDER — CHLORHEXIDINE GLUCONATE CLOTH 2 % EX PADS
6.0000 | MEDICATED_PAD | Freq: Once | CUTANEOUS | Status: DC
Start: 2023-09-08 — End: 2023-09-11

## 2023-09-08 MED ORDER — CHLORHEXIDINE GLUCONATE CLOTH 2 % EX PADS: 6.0000 | MEDICATED_PAD | Freq: Once | CUTANEOUS | Status: DC

## 2023-09-08 NOTE — Progress Notes (Signed)

## 2023-09-10 ENCOUNTER — Ambulatory Visit
Admission: RE | Admit: 2023-09-10 | Discharge: 2023-09-10 | Disposition: A | Discharge status: Home / Self Care | Payer: Medicare Other | Source: Ambulatory Visit | Attending: Surgery | Admitting: Surgery

## 2023-09-10 DIAGNOSIS — N6312 Unspecified lump in the right breast, upper inner quadrant: Secondary | ICD-10-CM | POA: Diagnosis not present

## 2023-09-10 DIAGNOSIS — N6310 Unspecified lump in the right breast, unspecified quadrant: Secondary | ICD-10-CM

## 2023-09-10 HISTORY — PX: BREAST BIOPSY: SHX20

## 2023-09-11 ENCOUNTER — Ambulatory Visit
Admission: RE | Admit: 2023-09-11 | Discharge: 2023-09-11 | Disposition: A | Discharge status: Home / Self Care | Payer: Medicare Other | Source: Ambulatory Visit | Attending: Surgery | Admitting: Surgery

## 2023-09-11 ENCOUNTER — Ambulatory Visit (HOSPITAL_BASED_OUTPATIENT_CLINIC_OR_DEPARTMENT_OTHER): Admitting: Anesthesiology

## 2023-09-11 ENCOUNTER — Other Ambulatory Visit: Payer: Self-pay

## 2023-09-11 ENCOUNTER — Encounter (HOSPITAL_BASED_OUTPATIENT_CLINIC_OR_DEPARTMENT_OTHER): Payer: Self-pay | Admitting: Surgery

## 2023-09-11 ENCOUNTER — Ambulatory Visit (HOSPITAL_BASED_OUTPATIENT_CLINIC_OR_DEPARTMENT_OTHER)
Admission: RE | Admit: 2023-09-11 | Discharge: 2023-09-11 | Disposition: A | Discharge status: Home / Self Care | Payer: Medicare Other | Attending: Surgery | Admitting: Surgery

## 2023-09-11 ENCOUNTER — Encounter (HOSPITAL_BASED_OUTPATIENT_CLINIC_OR_DEPARTMENT_OTHER)
Admission: RE | Disposition: A | Discharge status: Home / Self Care | Payer: Self-pay | Source: Home / Self Care | Attending: Surgery

## 2023-09-11 DIAGNOSIS — Z803 Family history of malignant neoplasm of breast: Secondary | ICD-10-CM | POA: Diagnosis not present

## 2023-09-11 DIAGNOSIS — N6081 Other benign mammary dysplasias of right breast: Secondary | ICD-10-CM | POA: Insufficient documentation

## 2023-09-11 DIAGNOSIS — N63 Unspecified lump in unspecified breast: Secondary | ICD-10-CM | POA: Diagnosis not present

## 2023-09-11 DIAGNOSIS — N189 Chronic kidney disease, unspecified: Secondary | ICD-10-CM | POA: Insufficient documentation

## 2023-09-11 DIAGNOSIS — N6310 Unspecified lump in the right breast, unspecified quadrant: Secondary | ICD-10-CM | POA: Diagnosis not present

## 2023-09-11 DIAGNOSIS — I129 Hypertensive chronic kidney disease with stage 1 through stage 4 chronic kidney disease, or unspecified chronic kidney disease: Secondary | ICD-10-CM | POA: Diagnosis not present

## 2023-09-11 DIAGNOSIS — D241 Benign neoplasm of right breast: Secondary | ICD-10-CM | POA: Diagnosis not present

## 2023-09-11 DIAGNOSIS — N6021 Fibroadenosis of right breast: Secondary | ICD-10-CM | POA: Diagnosis not present

## 2023-09-11 DIAGNOSIS — N6011 Diffuse cystic mastopathy of right breast: Secondary | ICD-10-CM | POA: Insufficient documentation

## 2023-09-11 DIAGNOSIS — N6312 Unspecified lump in the right breast, upper inner quadrant: Secondary | ICD-10-CM | POA: Diagnosis not present

## 2023-09-11 DIAGNOSIS — Z01818 Encounter for other preprocedural examination: Secondary | ICD-10-CM

## 2023-09-11 HISTORY — PX: RADIOACTIVE SEED GUIDED EXCISIONAL BREAST BIOPSY: SHX6490

## 2023-09-11 SURGERY — RADIOACTIVE SEED GUIDED BREAST BIOPSY
Anesthesia: General | Site: Breast | Laterality: Right

## 2023-09-11 MED ORDER — DEXAMETHASONE SODIUM PHOSPHATE 10 MG/ML IJ SOLN
INTRAMUSCULAR | Status: DC | PRN
  Administered 2023-09-11: 5 mg via INTRAVENOUS

## 2023-09-11 MED ORDER — FENTANYL CITRATE (PF) 100 MCG/2ML IJ SOLN
INTRAMUSCULAR | Status: AC
  Filled 2023-09-11: qty 2

## 2023-09-11 MED ORDER — PROPOFOL 10 MG/ML IV BOLUS
INTRAVENOUS | Status: DC | PRN
  Administered 2023-09-11: 150 mg via INTRAVENOUS
  Administered 2023-09-11: 30 mg via INTRAVENOUS
  Administered 2023-09-11: 20 mg via INTRAVENOUS

## 2023-09-11 MED ORDER — PROPOFOL 500 MG/50ML IV EMUL
INTRAVENOUS | Status: DC | PRN
  Administered 2023-09-11: 125 ug/kg/min via INTRAVENOUS

## 2023-09-11 MED ORDER — FENTANYL CITRATE (PF) 100 MCG/2ML IJ SOLN
INTRAMUSCULAR | Status: DC | PRN
  Administered 2023-09-11: 50 ug via INTRAVENOUS

## 2023-09-11 MED ORDER — LIDOCAINE 2% (20 MG/ML) 5 ML SYRINGE
INTRAMUSCULAR | Status: DC | PRN
  Administered 2023-09-11: 40 mg via INTRAVENOUS

## 2023-09-11 MED ORDER — 0.9 % SODIUM CHLORIDE (POUR BTL) OPTIME
TOPICAL | Status: DC | PRN
  Administered 2023-09-11: 1000 mL

## 2023-09-11 MED ORDER — BUPIVACAINE-EPINEPHRINE (PF) 0.25% -1:200000 IJ SOLN
INTRAMUSCULAR | Status: AC
  Filled 2023-09-11: qty 30

## 2023-09-11 MED ORDER — BUPIVACAINE HCL (PF) 0.25 % IJ SOLN
INTRAMUSCULAR | Status: AC
  Filled 2023-09-11: qty 30

## 2023-09-11 MED ORDER — ONDANSETRON HCL 4 MG/2ML IJ SOLN
INTRAMUSCULAR | Status: DC | PRN
  Administered 2023-09-11: 4 mg via INTRAVENOUS

## 2023-09-11 MED ORDER — ACETAMINOPHEN 500 MG PO TABS
ORAL_TABLET | ORAL | Status: AC
  Filled 2023-09-11: qty 2

## 2023-09-11 MED ORDER — ACETAMINOPHEN 500 MG PO TABS: 1000.0000 mg | ORAL_TABLET | ORAL | Status: AC

## 2023-09-11 MED ORDER — CEFAZOLIN SODIUM-DEXTROSE 2-4 GM/100ML-% IV SOLN
2.0000 g | INTRAVENOUS | Status: AC
  Administered 2023-09-11: 2 g via INTRAVENOUS

## 2023-09-11 MED ORDER — ARTIFICIAL TEARS OPHTHALMIC OINT
TOPICAL_OINTMENT | OPHTHALMIC | Status: AC
  Filled 2023-09-11: qty 3.5

## 2023-09-11 MED ORDER — EPHEDRINE SULFATE-NACL 50-0.9 MG/10ML-% IV SOSY
PREFILLED_SYRINGE | INTRAVENOUS | Status: DC | PRN
  Administered 2023-09-11: 10 mg via INTRAVENOUS

## 2023-09-11 MED ORDER — CEFAZOLIN SODIUM-DEXTROSE 2-4 GM/100ML-% IV SOLN
INTRAVENOUS | Status: AC
  Filled 2023-09-11: qty 100

## 2023-09-11 MED ORDER — OXYCODONE HCL 5 MG PO TABS: 5.0000 mg | ORAL_TABLET | Freq: Once | ORAL | Status: DC | PRN

## 2023-09-11 MED ORDER — ACETAMINOPHEN 500 MG PO TABS
1000.0000 mg | ORAL_TABLET | Freq: Once | ORAL | Status: AC
  Administered 2023-09-11: 1000 mg via ORAL

## 2023-09-11 MED ORDER — FENTANYL CITRATE (PF) 100 MCG/2ML IJ SOLN
25.0000 ug | INTRAMUSCULAR | Status: DC | PRN
  Administered 2023-09-11: 50 ug via INTRAVENOUS

## 2023-09-11 MED ORDER — BUPIVACAINE-EPINEPHRINE 0.25% -1:200000 IJ SOLN
INTRAMUSCULAR | Status: DC | PRN
  Administered 2023-09-11: 10 mL

## 2023-09-11 MED ORDER — OXYCODONE HCL 5 MG/5ML PO SOLN: 5.0000 mg | Freq: Once | ORAL | Status: DC | PRN

## 2023-09-11 MED ORDER — LACTATED RINGERS IV SOLN: INTRAVENOUS | Status: DC

## 2023-09-11 MED ORDER — AMISULPRIDE (ANTIEMETIC) 5 MG/2ML IV SOLN: 10.0000 mg | Freq: Once | INTRAVENOUS | Status: DC | PRN

## 2023-09-11 SURGICAL SUPPLY — 39 items
APPLIER CLIP 9.375 MED OPEN (MISCELLANEOUS) IMPLANT
BENZOIN TINCTURE PRP APPL 2/3 (GAUZE/BANDAGES/DRESSINGS) ×1 IMPLANT
BLADE HEX COATED 2.75 (ELECTRODE) ×1 IMPLANT
BLADE SURG 15 STRL LF DISP TIS (BLADE) ×1 IMPLANT
CANISTER SUCT 1200ML W/VALVE (MISCELLANEOUS) ×1 IMPLANT
CHLORAPREP W/TINT 26 (MISCELLANEOUS) ×1 IMPLANT
CLIP APPLIE 9.375 MED OPEN (MISCELLANEOUS) IMPLANT
COVER BACK TABLE 60X90IN (DRAPES) ×1 IMPLANT
COVER MAYO STAND STRL (DRAPES) ×1 IMPLANT
COVER PROBE CYLINDRICAL 5X96 (MISCELLANEOUS) ×1 IMPLANT
DRAPE LAPAROTOMY 100X72 PEDS (DRAPES) ×1 IMPLANT
DRAPE UTILITY XL STRL (DRAPES) ×1 IMPLANT
DRSG TEGADERM 4X4.75 (GAUZE/BANDAGES/DRESSINGS) ×1 IMPLANT
ELECT REM PT RETURN 9FT ADLT (ELECTROSURGICAL) ×1 IMPLANT
ELECTRODE REM PT RTRN 9FT ADLT (ELECTROSURGICAL) ×1 IMPLANT
GAUZE SPONGE 2X2 STRL 8-PLY (GAUZE/BANDAGES/DRESSINGS) IMPLANT
GAUZE SPONGE 4X4 12PLY STRL LF (GAUZE/BANDAGES/DRESSINGS) ×1 IMPLANT
GLOVE BIO SURGEON STRL SZ7 (GLOVE) ×1 IMPLANT
GLOVE BIOGEL PI IND STRL 7.5 (GLOVE) ×1 IMPLANT
GOWN STRL REUS W/ TWL LRG LVL3 (GOWN DISPOSABLE) ×2 IMPLANT
KIT MARKER MARGIN INK (KITS) ×1 IMPLANT
NDL HYPO 25X1 1.5 SAFETY (NEEDLE) ×1 IMPLANT
NEEDLE HYPO 25X1 1.5 SAFETY (NEEDLE) ×1 IMPLANT
NS IRRIG 1000ML POUR BTL (IV SOLUTION) ×1 IMPLANT
PACK BASIN DAY SURGERY FS (CUSTOM PROCEDURE TRAY) ×1 IMPLANT
PENCIL SMOKE EVACUATOR (MISCELLANEOUS) ×1 IMPLANT
SLEEVE SCD COMPRESS KNEE MED (STOCKING) ×1 IMPLANT
SPIKE FLUID TRANSFER (MISCELLANEOUS) IMPLANT
SPONGE T-LAP 4X18 ~~LOC~~+RFID (SPONGE) ×1 IMPLANT
STRIP CLOSURE SKIN 1/2X4 (GAUZE/BANDAGES/DRESSINGS) ×1 IMPLANT
SUT MON AB 4-0 PC3 18 (SUTURE) ×1 IMPLANT
SUT SILK 2 0 SH (SUTURE) IMPLANT
SUT VIC AB 3-0 SH 27X BRD (SUTURE) ×1 IMPLANT
SYR 10ML LL (SYRINGE) IMPLANT
SYR CONTROL 10ML LL (SYRINGE) ×1 IMPLANT
TOWEL GREEN STERILE FF (TOWEL DISPOSABLE) ×1 IMPLANT
TRAY FAXITRON CT DISP (TRAY / TRAY PROCEDURE) ×1 IMPLANT
TUBE CONNECTING 20X1/4 (TUBING) ×1 IMPLANT
YANKAUER SUCT BULB TIP NO VENT (SUCTIONS) ×1 IMPLANT

## 2023-09-11 NOTE — Op Note (Signed)
 Pre-op Diagnosis:  Right breast mass on mammogram Post-op Diagnosis: same Procedure:  Right breast radioactive seed localized excisional biopsy Surgeon:  Stellan Vick K. Anesthesia:  GEN - LMA Indications:  This is a 73 year old female with a history of breast cancer in 2 sisters as well as 1 niece. Recently she underwent routine screening mammogram that detected a mass in the right breast at 1:00. Ultrasound revealed a 6 x 4 x 5 mm mass located at 1:00, 4 cm from the nipple. She underwent biopsy of this area that showed benign breast tissue. This was felt to be discordant so the patient is referred to [place] for excisional biopsy.  Description of procedure: The patient is brought to the operating room placed in supine position on the operating room table. After an adequate level of general anesthesia was obtained, her right breast was prepped with ChloraPrep and draped in sterile fashion. A timeout was taken to ensure the proper patient and proper procedure. We interrogated the breast with the neoprobe. We made a circumareolar incision around the upper side of the nipple after infiltrating with 0.25% Marcaine. Dissection was carried down in the breast tissue with cautery. We used the neoprobe to guide [place] towards the radioactive seed. We excised an area of tissue around the radioactive seed 1.5 cm in diameter. The specimen was removed and was oriented with a paint kit. Specimen mammogram showed the radioactive seed as well as the biopsy clip within the specimen. This was sent for pathologic examination. There is no residual radioactivity within the biopsy cavity. We inspected carefully for hemostasis. The wound was thoroughly irrigated. The wound was closed with a deep layer of 3-0 Vicryl and a subcuticular layer of 4-0 Monocryl. Benzoin Steri-Strips were applied. The patient was then extubated and brought to the recovery room in stable condition. All sponge, instrument, and needle counts are correct.  Wilmon Arms. Corliss Skains, MD, [facility]  General/ Trauma Surgery  09/11/2023 9:39 AM

## 2023-09-11 NOTE — Anesthesia Preprocedure Evaluation (Addendum)
 Anesthesia Evaluation  Patient identified by MRN, date of birth, ID band Patient awake    Reviewed: Allergy & Precautions, NPO status , Patient's Chart, lab work & pertinent test results  History of Anesthesia Complications (+) PONV and history of anesthetic complications  Airway Mallampati: III  TM Distance: >3 FB   Mouth opening: Limited Mouth Opening  Dental  (+) Lower Dentures, Upper Dentures, Dental Advisory Given   Pulmonary neg pulmonary ROS   Pulmonary exam normal breath sounds clear to auscultation       Cardiovascular hypertension, Pt. on medications Normal cardiovascular exam Rhythm:Regular Rate:Normal     Neuro/Psych negative neurological ROS  negative psych ROS   GI/Hepatic negative GI ROS, Neg liver ROS,,,  Endo/Other  negative endocrine ROS    Renal/GU Renal InsufficiencyRenal disease  negative genitourinary   Musculoskeletal negative musculoskeletal ROS (+)    Abdominal   Peds  Hematology negative hematology ROS (+)   Anesthesia Other Findings   Reproductive/Obstetrics                             Anesthesia Physical Anesthesia Plan  ASA: 2  Anesthesia Plan: General   Post-op Pain Management: Tylenol PO (pre-op)*   Induction: Intravenous  PONV Risk Score and Plan: 4 or greater and Ondansetron, Dexamethasone, Treatment may vary due to age or medical condition and TIVA  Airway Management Planned: LMA  Additional Equipment:   Intra-op Plan:   Post-operative Plan: Extubation in OR  Informed Consent: I have reviewed the patients History and Physical, chart, labs and discussed the procedure including the risks, benefits and alternatives for the proposed anesthesia with the patient or authorized representative who has indicated his/her understanding and acceptance.     Dental advisory given  Plan Discussed with: CRNA  Anesthesia Plan Comments:         Anesthesia Quick Evaluation

## 2023-09-11 NOTE — Anesthesia Procedure Notes (Signed)
 Procedure Name: LMA Insertion Date/Time: 09/11/2023 8:57 AM  Performed by: Demetrio Lapping, CRNAPre-anesthesia Checklist: Patient identified, Emergency Drugs available, Suction available and Patient being monitored Patient Re-evaluated:Patient Re-evaluated prior to induction Oxygen Delivery Method: Circle System Utilized Preoxygenation: Pre-oxygenation with 100% oxygen Induction Type: IV induction Ventilation: Mask ventilation without difficulty LMA: LMA inserted LMA Size: 4.0 Number of attempts: 1 Airway Equipment and Method: Bite block Placement Confirmation: positive ETCO2 Tube secured with: Tape Dental Injury: Teeth and Oropharynx as per pre-operative assessment

## 2023-09-11 NOTE — Discharge Instructions (Addendum)
 [facility] Office Phone Number [phone]  BREAST BIOPSY/ PARTIAL MASTECTOMY: POST OP INSTRUCTIONS  Always review your discharge instruction sheet given to you by the facility where your surgery was performed.  IF YOU HAVE DISABILITY OR FAMILY LEAVE FORMS, YOU MUST BRING THEM TO THE OFFICE FOR PROCESSING.  DO NOT GIVE THEM TO YOUR DOCTOR.  A prescription for pain medication may be given to you upon discharge.  Take your pain medication as prescribed, if needed.  If narcotic pain medicine is not needed, then you may take acetaminophen (Tylenol) or ibuprofen (Advil) as needed. Take your usually prescribed medications unless otherwise directed If you need a refill on your pain medication, please contact your pharmacy.  They will contact our office to request authorization.  Prescriptions will not be filled after 5pm or on week-ends. You should eat very light the first 24 hours after surgery, such as soup, crackers, pudding, etc.  Resume your normal diet the day after surgery. Most patients will experience some swelling and bruising in the breast.  Ice packs and a good support bra will help.  Swelling and bruising can take several days to resolve.  It is common to experience some constipation if taking pain medication after surgery.  Increasing fluid intake and taking a stool softener will usually help or prevent this problem from occurring.  A mild laxative (Milk of Magnesia or Miralax) should be taken according to package directions if there are no bowel movements after 48 hours. Unless discharge instructions indicate otherwise, you may remove your bandages 24-48 hours after surgery, and you may shower at that time.  You may have steri-strips (small skin tapes) in place directly over the incision.  These strips should be left on the skin for 7-10 days.  If your surgeon used skin glue on the incision, you may shower in 24 hours.  The glue will flake off over the next 2-3 weeks.  Any  sutures or staples will be removed at the office during your follow-up visit. ACTIVITIES:  You may resume regular daily activities (gradually increasing) beginning the next day.  Wearing a good support bra or sports bra minimizes pain and swelling.  You may have sexual intercourse when it is comfortable. You may drive when you no longer are taking prescription pain medication, you can comfortably wear a seatbelt, and you can safely maneuver your car and apply brakes. RETURN TO WORK:  ______________________________________________________________________________________ Mallory Gaines should see your doctor in the office for a follow-up appointment approximately two weeks after your surgery.  Your doctor's nurse will typically make your follow-up appointment when she calls you with your pathology report.  Expect your pathology report 2-3 business days after your surgery.  You may call to check if you do not hear from [place] after three days. OTHER INSTRUCTIONS: _______________________________________________________________________________________________ _____________________________________________________________________________________________________________________________________ _____________________________________________________________________________________________________________________________________ _____________________________________________________________________________________________________________________________________  WHEN TO CALL YOUR DOCTOR: Fever over 101.0 Nausea and/or vomiting. Extreme swelling or bruising. Continued bleeding from incision. Increased pain, redness, or drainage from the incision.  The clinic staff is available to answer your questions during regular business hours.  Please don't hesitate to call and ask to speak to one of the nurses for clinical concerns.  If you have a medical emergency, go to the nearest emergency room or call 911.  A surgeon from [facility] is always on call at the hospital.  For further questions, please visit [email or web address]   Post Anesthesia Home Care Instructions  Activity: Get plenty of rest for the remainder of the  day. A responsible individual must stay with you for 24 hours following the procedure.  For the next 24 hours, DO NOT: -Drive a car -Advertising copywriter -Drink alcoholic beverages -Take any medication unless instructed by your physician -Make any legal decisions or sign important papers.  Meals: Start with liquid foods such as gelatin or soup. Progress to regular foods as tolerated. Avoid greasy, spicy, heavy foods. If nausea and/or vomiting occur, drink only clear liquids until the nausea and/or vomiting subsides. Call your physician if vomiting continues.  Special Instructions/Symptoms: Your throat may feel dry or sore from the anesthesia or the breathing tube placed in your throat during surgery. If this causes discomfort, gargle with warm salt water. The discomfort should disappear within 24 hours.  If you had a scopolamine patch placed behind your ear for the management of post- operative nausea and/or vomiting:  1. The medication in the patch is effective for 72 hours, after which it should be removed.  Wrap patch in a tissue and discard in the trash. Wash hands thoroughly with soap and water. 2. You may remove the patch earlier than 72 hours if you experience unpleasant side effects which may include dry mouth, dizziness or visual disturbances. 3. Avoid touching the patch. Wash your hands with soap and water after contact with the patch.    Next dose of Tyelnol may be taken at 2p

## 2023-09-11 NOTE — Transfer of Care (Signed)
 Immediate Anesthesia Transfer of Care Note  Patient: Mallory Gaines  Procedure(s) Performed: RIGHT BREAST RADIOACTIVE SEED LOCALIZED EXCISIONAL BIOPSY (Right: Breast)  Patient Location: PACU  Anesthesia Type:General  Level of Consciousness: awake and patient cooperative  Airway & Oxygen Therapy: Patient Spontanous Breathing and Patient connected to face mask oxygen  Post-op Assessment: Report given to RN and Post -op Vital signs reviewed and stable  Post vital signs: Reviewed and stable  Last Vitals:  Vitals Value Taken Time  BP 112/64 09/11/23 0945  Temp 36.3 C 09/11/23 0945  Pulse 67 09/11/23 0951  Resp 17 09/11/23 0951  SpO2 100 % 09/11/23 0951  Vitals shown include unfiled device data.  Last Pain:  Vitals:   09/11/23 0945  TempSrc:   PainSc: Asleep      Patients Stated Pain Goal: 3 (09/11/23 0745)  Complications: No notable events documented.

## 2023-09-11 NOTE — Interval H&P Note (Signed)
 History and Physical Interval Note:  09/11/2023 8:23 AM  Mallory Gaines  has presented today for surgery, with the diagnosis of RIGHT BREAST MASS.  The various methods of treatment have been discussed with the patient and family. After consideration of risks, benefits and other options for treatment, the patient has consented to  Procedure(s): RIGHT BREAST RADIOACTIVE SEED LOCALIZED EXCISIONAL BIOPSY (Right) as a surgical intervention.  The patient's history has been reviewed, patient examined, no change in status, stable for surgery.  I have reviewed the patient's chart and labs.  Questions were answered to the patient's satisfaction.     Wynona Luna

## 2023-09-11 NOTE — Anesthesia Postprocedure Evaluation (Signed)
 Anesthesia Post Note  Patient: Mallory Gaines  Procedure(s) Performed: RIGHT BREAST RADIOACTIVE SEED LOCALIZED EXCISIONAL BIOPSY (Right: Breast)     Patient location during evaluation: PACU Anesthesia Type: General Level of consciousness: awake and alert Pain management: pain level controlled Vital Signs Assessment: post-procedure vital signs reviewed and stable Respiratory status: spontaneous breathing, nonlabored ventilation, respiratory function stable and patient connected to nasal cannula oxygen Cardiovascular status: blood pressure returned to baseline and stable Postop Assessment: no apparent nausea or vomiting Anesthetic complications: no  No notable events documented.  Last Vitals:  Vitals:   09/11/23 1045 09/11/23 1105  BP: 133/71 117/63  Pulse: (!) 53 62  Resp: 11 14  Temp:  (!) 36.3 C  SpO2: 93% 94%    Last Pain:  Vitals:   09/11/23 1105  TempSrc: Temporal  PainSc: 3                  Sherina Stammer L [name]

## 2023-09-12 ENCOUNTER — Encounter (HOSPITAL_BASED_OUTPATIENT_CLINIC_OR_DEPARTMENT_OTHER): Payer: Self-pay | Admitting: Surgery

## 2023-09-14 LAB — SURGICAL PATHOLOGY

## 2023-11-17 DIAGNOSIS — N1831 Chronic kidney disease, stage 3a: Secondary | ICD-10-CM | POA: Diagnosis not present

## 2023-11-17 DIAGNOSIS — M810 Age-related osteoporosis without current pathological fracture: Secondary | ICD-10-CM | POA: Diagnosis not present

## 2023-11-17 DIAGNOSIS — Z0000 Encounter for general adult medical examination without abnormal findings: Secondary | ICD-10-CM | POA: Diagnosis not present

## 2023-11-17 DIAGNOSIS — I10 Essential (primary) hypertension: Secondary | ICD-10-CM | POA: Diagnosis not present

## 2023-11-17 DIAGNOSIS — E785 Hyperlipidemia, unspecified: Secondary | ICD-10-CM | POA: Diagnosis not present

## 2023-12-09 ENCOUNTER — Other Ambulatory Visit (HOSPITAL_BASED_OUTPATIENT_CLINIC_OR_DEPARTMENT_OTHER): Payer: Self-pay | Admitting: Internal Medicine

## 2023-12-09 DIAGNOSIS — N2581 Secondary hyperparathyroidism of renal origin: Secondary | ICD-10-CM | POA: Diagnosis not present

## 2023-12-09 DIAGNOSIS — D631 Anemia in chronic kidney disease: Secondary | ICD-10-CM | POA: Diagnosis not present

## 2023-12-09 DIAGNOSIS — E785 Hyperlipidemia, unspecified: Secondary | ICD-10-CM | POA: Diagnosis not present

## 2023-12-09 DIAGNOSIS — N189 Chronic kidney disease, unspecified: Secondary | ICD-10-CM | POA: Diagnosis not present

## 2023-12-09 DIAGNOSIS — Z0000 Encounter for general adult medical examination without abnormal findings: Secondary | ICD-10-CM

## 2023-12-09 DIAGNOSIS — N1831 Chronic kidney disease, stage 3a: Secondary | ICD-10-CM | POA: Diagnosis not present

## 2023-12-09 DIAGNOSIS — I129 Hypertensive chronic kidney disease with stage 1 through stage 4 chronic kidney disease, or unspecified chronic kidney disease: Secondary | ICD-10-CM | POA: Diagnosis not present

## 2024-03-28 DIAGNOSIS — H524 Presbyopia: Secondary | ICD-10-CM | POA: Diagnosis not present

## 2024-03-28 DIAGNOSIS — H5213 Myopia, bilateral: Secondary | ICD-10-CM | POA: Diagnosis not present

## 2024-03-28 DIAGNOSIS — H25013 Cortical age-related cataract, bilateral: Secondary | ICD-10-CM | POA: Diagnosis not present

## 2024-03-28 DIAGNOSIS — H2513 Age-related nuclear cataract, bilateral: Secondary | ICD-10-CM | POA: Diagnosis not present

## 2024-08-11 ENCOUNTER — Ambulatory Visit (HOSPITAL_BASED_OUTPATIENT_CLINIC_OR_DEPARTMENT_OTHER)
Admission: RE | Admit: 2024-08-11 | Discharge: 2024-08-11 | Disposition: A | Source: Ambulatory Visit | Attending: Internal Medicine | Admitting: Internal Medicine

## 2024-08-11 DIAGNOSIS — Z0000 Encounter for general adult medical examination without abnormal findings: Secondary | ICD-10-CM
